# Patient Record
Sex: Male | Born: 1960 | Race: White | Hispanic: No | Marital: Married | Smoking: Never smoker
Health system: Southern US, Community
[De-identification: ages and names within clinical notes are randomized; demographics above are authoritative.]

## PROBLEM LIST (undated history)

## (undated) DIAGNOSIS — I499 Cardiac arrhythmia, unspecified: Secondary | ICD-10-CM

## (undated) DIAGNOSIS — G4730 Sleep apnea, unspecified: Secondary | ICD-10-CM

## (undated) DIAGNOSIS — E119 Type 2 diabetes mellitus without complications: Secondary | ICD-10-CM

## (undated) DIAGNOSIS — I10 Essential (primary) hypertension: Secondary | ICD-10-CM

## (undated) DIAGNOSIS — M1990 Unspecified osteoarthritis, unspecified site: Secondary | ICD-10-CM

## (undated) DIAGNOSIS — J189 Pneumonia, unspecified organism: Secondary | ICD-10-CM

## (undated) HISTORY — PX: CHOLECYSTECTOMY: SHX55

## (undated) HISTORY — PX: SHOULDER SURGERY: SHX246

## (undated) HISTORY — PX: TONSILLECTOMY: SUR1361

## (undated) HISTORY — PX: BACK SURGERY: SHX140

---

## 1998-04-08 ENCOUNTER — Ambulatory Visit (HOSPITAL_COMMUNITY): Admission: RE | Admit: 1998-04-08 | Discharge: 1998-04-08 | Payer: Self-pay | Admitting: Specialist

## 1998-05-07 ENCOUNTER — Inpatient Hospital Stay (HOSPITAL_COMMUNITY): Admission: RE | Admit: 1998-05-07 | Discharge: 1998-05-10 | Payer: Self-pay | Admitting: Specialist

## 1998-07-19 ENCOUNTER — Encounter: Payer: Self-pay | Admitting: Specialist

## 1998-07-19 ENCOUNTER — Ambulatory Visit (HOSPITAL_COMMUNITY): Admission: RE | Admit: 1998-07-19 | Discharge: 1998-07-19 | Payer: Self-pay | Admitting: Specialist

## 1999-11-11 ENCOUNTER — Encounter: Payer: Self-pay | Admitting: Specialist

## 1999-11-11 ENCOUNTER — Ambulatory Visit (HOSPITAL_COMMUNITY): Admission: RE | Admit: 1999-11-11 | Discharge: 1999-11-11 | Payer: Self-pay | Admitting: Specialist

## 2005-03-02 ENCOUNTER — Encounter: Admission: RE | Admit: 2005-03-02 | Discharge: 2005-03-02 | Payer: Self-pay | Admitting: Orthopedic Surgery

## 2005-05-14 ENCOUNTER — Encounter: Admission: RE | Admit: 2005-05-14 | Discharge: 2005-05-14 | Payer: Self-pay | Admitting: Specialist

## 2006-01-10 ENCOUNTER — Encounter: Payer: Self-pay | Admitting: Specialist

## 2007-09-02 ENCOUNTER — Encounter: Admission: RE | Admit: 2007-09-02 | Discharge: 2007-09-02 | Payer: Self-pay | Admitting: *Deleted

## 2007-09-02 ENCOUNTER — Ambulatory Visit (HOSPITAL_COMMUNITY): Admission: RE | Admit: 2007-09-02 | Discharge: 2007-09-02 | Payer: Self-pay | Admitting: *Deleted

## 2007-09-02 ENCOUNTER — Encounter (INDEPENDENT_AMBULATORY_CARE_PROVIDER_SITE_OTHER): Payer: Self-pay | Admitting: *Deleted

## 2007-09-09 ENCOUNTER — Encounter: Admission: RE | Admit: 2007-09-09 | Discharge: 2007-09-09 | Payer: Self-pay | Admitting: *Deleted

## 2009-05-01 IMAGING — CT CT ABDOMEN W/ CM
2 of 5 series · 17 of 46 positions shown, 19 images · IV contrast (30CC OMNI 350 & [ID] OMNI 300)
Comparison: None

ABDOMEN CT WITH CONTRAST

CLINICAL DATA: pain and increased white blood cell count. Prior appendectomy.
TECHNIQUE: Multidetector CT imaging of the abdomen and pelvis was performed
following the standard protocol during bolus administration of intravenous
contrast.

Contrast:  125 cc Omnipaque 300

[Series 2: abdomen w/out cm · axial · 0.86mm/px · z∈[-410,-25]mm · 14 of 84 slices shown, 16 images]
[im 5/84  soft-tissue]
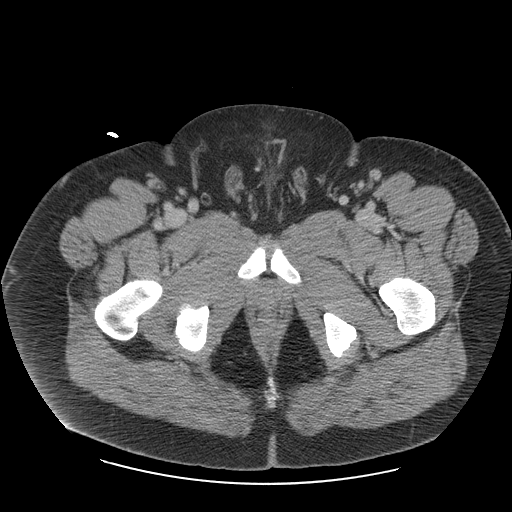
[im 5/84  bone]
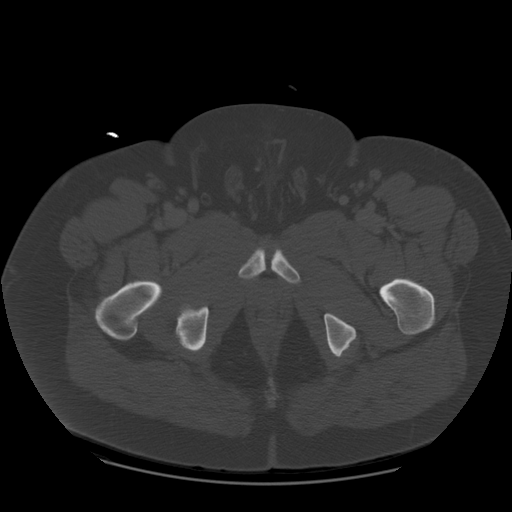
[im 13/84  soft-tissue]
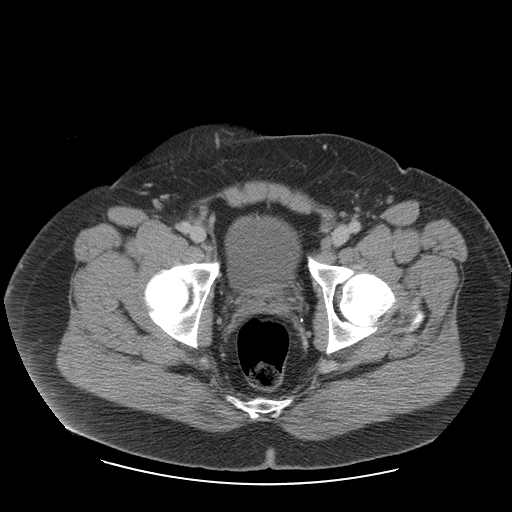
[im 17/84  soft-tissue]
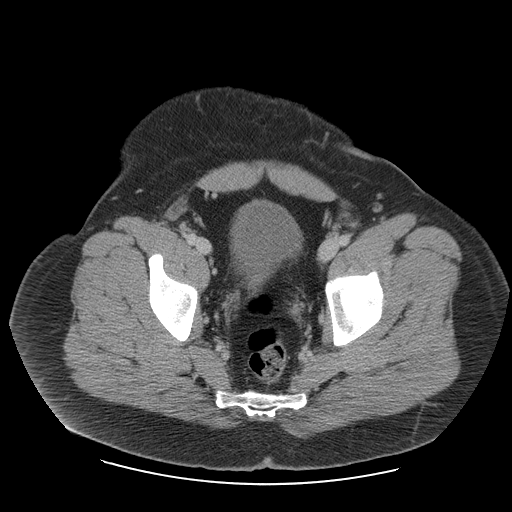
[im 21/84  soft-tissue]
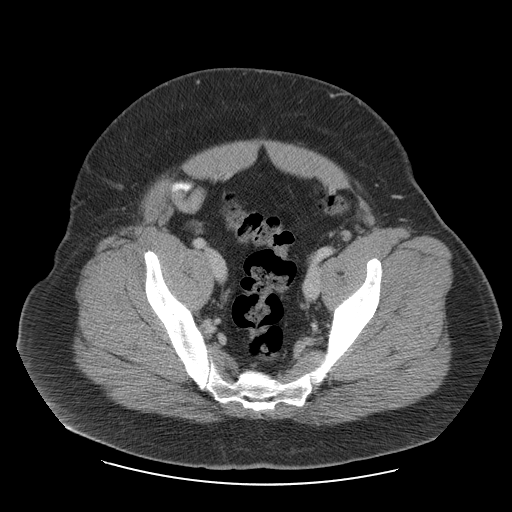
[im 30/84  soft-tissue]
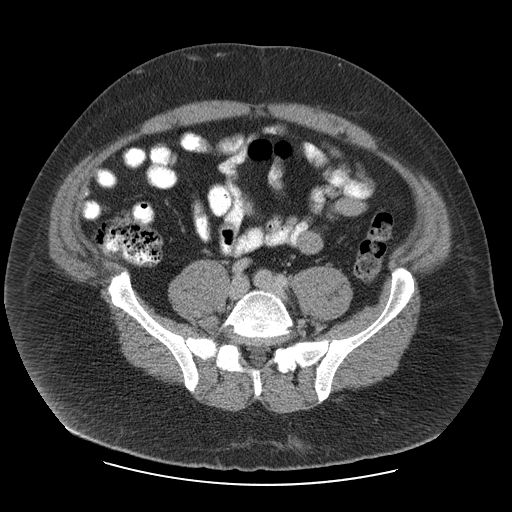
[im 34/84  soft-tissue]
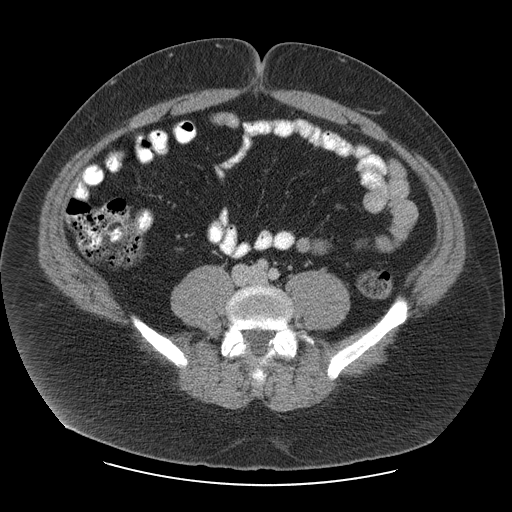
[im 38/84  soft-tissue]
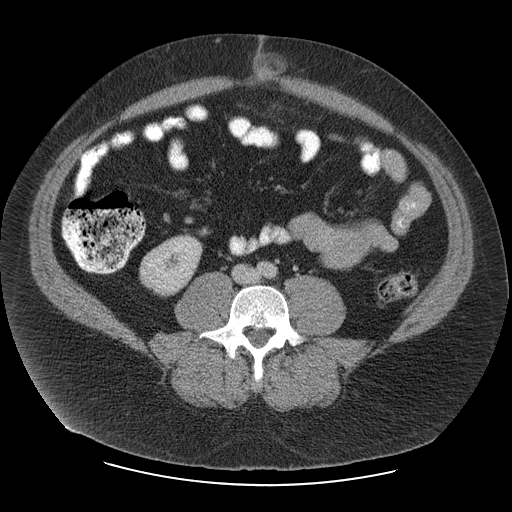
[im 46/84  soft-tissue]
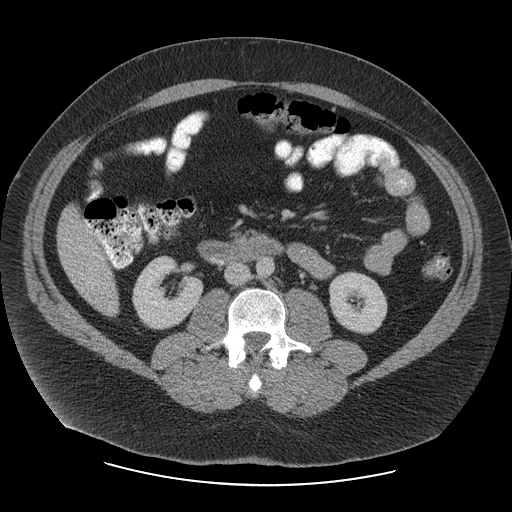
[im 50/84  soft-tissue]
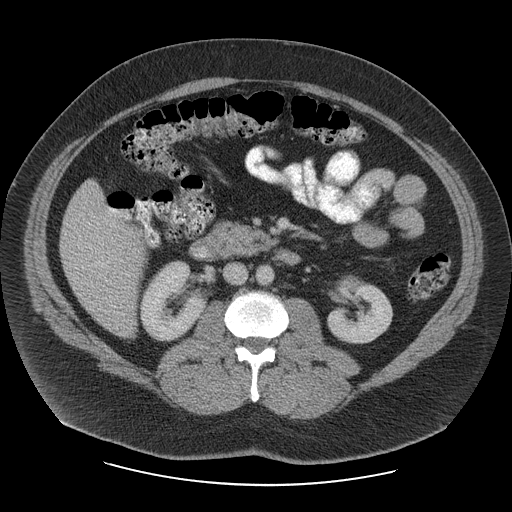
[im 50/84  bone]
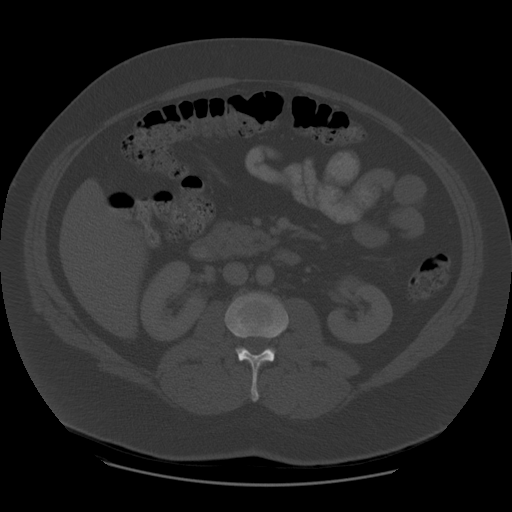
[im 54/84  soft-tissue]
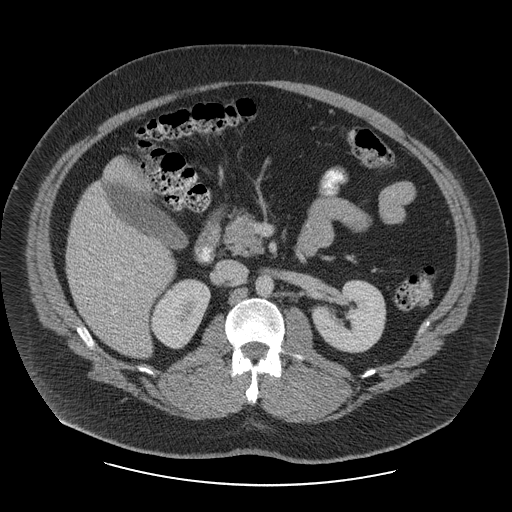
[im 63/84  soft-tissue]
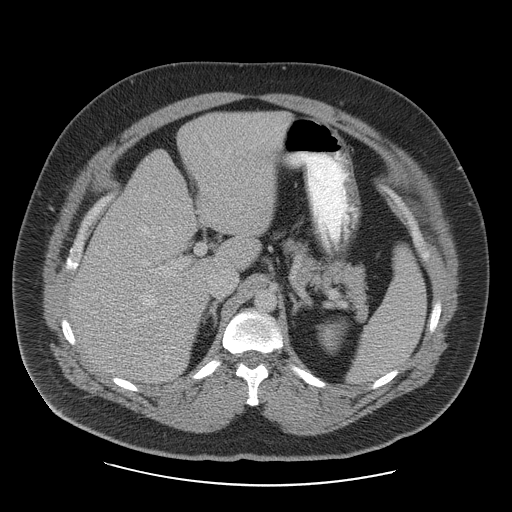
[im 67/84  soft-tissue]
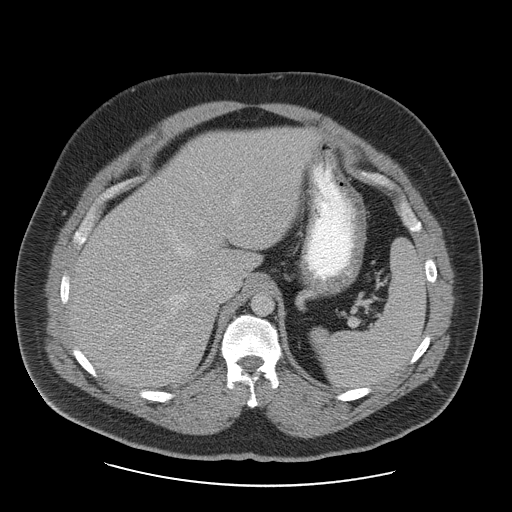
[im 71/84  soft-tissue]
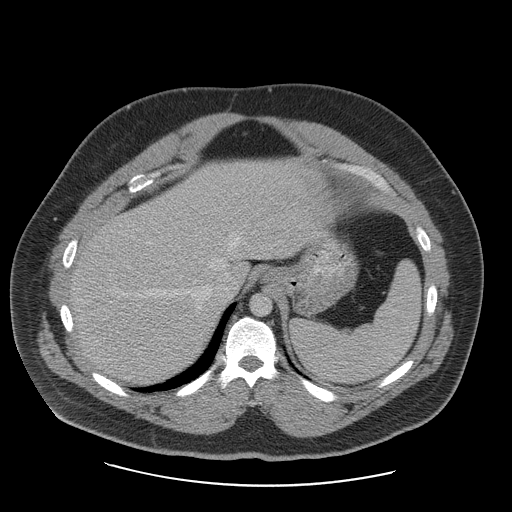
[im 79/84  soft-tissue]
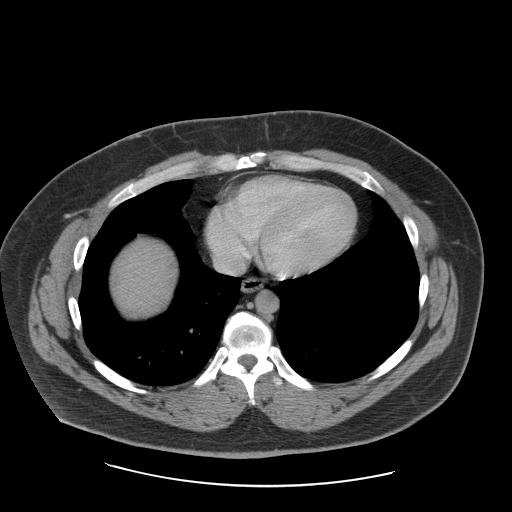

[Series 400: cor abd · coronal · 0.91mm/px · 3 of 134 slices shown]
[im 45/134  soft-tissue]
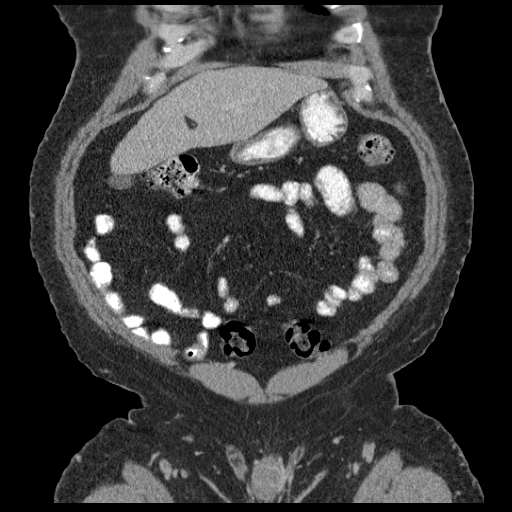
[im 60/134  soft-tissue]
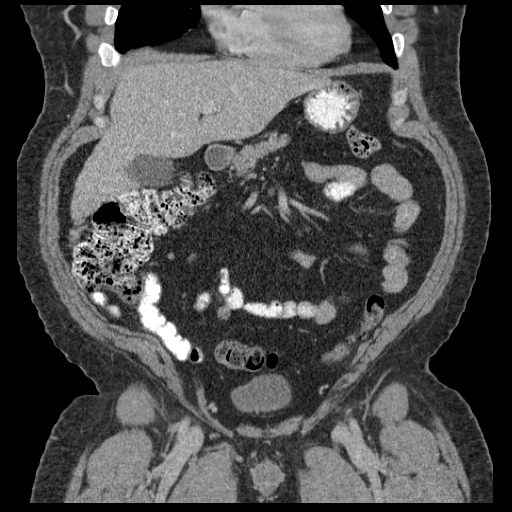
[im 74/134  soft-tissue]
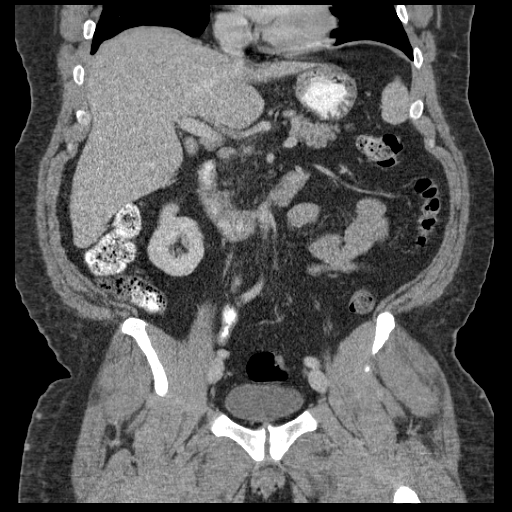

[17 of 46 positions shown; findings below may reference images not displayed]

FINDINGS: Clear lung bases. Normal heart size without pericardial or pleural
effusion.

normal liver. Old granulomatous disease in the spleen. Normal stomach, pancreas,
gallbladder, biliary tract, adrenal glands, and kidneys. No retroperitoneal or
retro crural adenopathy.

Descending colonic diverticulosis. No evidence of diverticulitis.

Normal terminal ileum on image 52. Cholecystectomy.

Small bowel is normal and there is no ascites. A left paracentral ventral hernia
contains omental fat on image 45.

IMPRESSION

1. No acute abdominal process.
2. Small ventral abdominal hernia contains only fat.

PELVIS CT WITH CONTRAST
FINDINGS: Normal pelvic bowel loops. No pelvic adenopathy. Normal urinary
bladder and prostate. Prominent vessels in the scrotum may relate to
varicoceles. Image 92. No cause identified within the abdomen.

Prior surgical defect right iliac crest. Bilateral pars defects at L5.

L5-S1 disc bulge.

IMPRESSION

1. No acute pelvic process.
2. Prominent vessels in the scrotum may relate to varicoceles. Incompletely
imaged. Clinically correlate.

## 2011-01-24 NOTE — Op Note (Signed)
Juan Decker, Juan Decker                 ACCOUNT NO.:  [id number]   MEDICAL RECORD NO.:  [id number]          PATIENT TYPE:  AMB   LOCATION:  ENDO                         FACILITY:  [facility]   PHYSICIAN:  Georgiana Spinner, M.D.    DATE OF BIRTH:  03-01-1961   DATE OF PROCEDURE:  09/02/2007  DATE OF DISCHARGE:                               OPERATIVE REPORT   PROCEDURE:  Upper endoscopy with biopsy.   INDICATIONS:  Abdominal pain.   ANESTHESIA:  Fentanyl 50 mcg, Versed 6 mg.   DESCRIPTION OF PROCEDURE:  With the patient mildly sedated in the left  lateral decubitus position, the Pentax videoscopic endoscope was  inserted in the mouth, passed under direct vision through the esophagus  which appeared to be mildly inflamed at the squamocolumnar junction, was  photographed and biopsies were taken.  We entered into the stomach,  fundus, body, antrum, duodenal bulb, second portion of the duodenum  appeared normal.   From this point the endoscope was slowly withdrawn, taking  circumferential views of the duodenal mucosa until the endoscope had  been pulled back, and the stomach placed in retroflexion to view the  stomach from below.  The endoscope was straightened and withdrawn,  taking circumferential views of the remaining gastric and esophageal  mucosa.  The patient's vital signs, and pulse oximeter remained stable.  The patient tolerated the procedure well without apparent complication.   FINDINGS:  Mild erythema of the squamocolumnar junction biopsied.  Await  biopsy report.  The patient will call me for results, and follow up with  me as an outpatient.           ______________________________  Georgiana Spinner, M.D.     GMO/MEDQ  D:  09/02/2007  T:  09/02/2007  Job:  [id number]

## 2012-02-13 ENCOUNTER — Encounter: Payer: Self-pay | Admitting: Obstetrics and Gynecology

## 2020-03-18 NOTE — Patient Instructions (Addendum)
DUE TO COVID-19 ONLY ONE VISITOR IS ALLOWED TO COME WITH YOU AND STAY IN THE WAITING ROOM ONLY DURING PRE OP AND PROCEDURE DAY OF SURGERY. TWO  VISITOR MAY VISIT WITH YOU AFTER SURGERY IN YOUR PRIVATE ROOM DURING VISITING HOURS ONLY!  10a-8p  YOU NEED TO HAVE A COVID 19 TEST ON_7-16-21______ @_______ , THIS TEST MUST BE DONE BEFORE SURGERY, COME  [address], [place] , .  [facility]) ONCE YOUR COVID TEST IS COMPLETED, PLEASE BEGIN THE QUARANTINE INSTRUCTIONS AS OUTLINED IN YOUR HANDOUT.                Juan Decker  03/18/2020   Your procedure is scheduled on: 03-30-20   Report to [facility] Main  Entrance   Report to admitting at     1025 AM     Call this number if you have problems the morning of surgery [phone]    Remember: NO SOLID FOOD AFTER MIDNIGHT THE NIGHT PRIOR TO SURGERY. NOTHING BY MOUTH EXCEPT CLEAR LIQUIDS UNTIL    0950 am  . PLEASE FINISH G2  DRINK PER SURGEON ORDER  WHICH NEEDS TO BE COMPLETED AT     0950 am then nothing by mouth.    CLEAR LIQUID DIET   Foods Allowed                                                                                        Foods Excluded  Coffee and tea, regular and decaf   No creamer                            liquids that you cannot  Plain Jell-O any favor except red or purple                                           see through such as: Fruit ices (not with fruit pulp)                                                           milk, soups, orange juice  Iced Popsicles                                                                  All solid food Carbonated beverages, regular and diet                                    Cranberry, grape and apple juices Sports drinks like Gatorade Lightly seasoned clear  broth or consume(fat free) Sugar, honey syrup   _____________________________________________________________________    BRUSH YOUR TEETH MORNING OF SURGERY AND RINSE YOUR MOUTH OUT,  NO CHEWING GUM CANDY OR MINTS.     Take these medicines the morning of surgery with A SIP OF WATER: lyrica, morphine, metoprolol, flecainide  Glipizide day before surgery only take morning and lunch dose  No dinner or bedtime dose   DO NOT TAKE ANY DIABETIC MEDICATIONS DAY OF YOUR SURGERY                               You may not have any metal on your body including hair pins and              piercings  Do not wear jewelry, lotions, powders or perfumes, deodorant                         Men may shave face and neck.   Do not bring valuables to the hospital. [facility] IS NOT             RESPONSIBLE   FOR VALUABLES.  Contacts, dentures or bridgework may not be worn into surgery.    Patients discharged the day of surgery will not be allowed to drive home. IF YOU ARE HAVING SURGERY AND GOING HOME THE SAME DAY, YOU MUST HAVE AN ADULT TO DRIVE YOU HOME AND BE WITH YOU FOR 24 HOURS. YOU MAY GO HOME BY TAXI OR UBER OR ORTHERWISE, BUT AN ADULT MUST ACCOMPANY YOU HOME AND STAY WITH YOU FOR 24 HOURS.  Name and phone number of your driver:  Special Instructions: N/A              Please read over the following fact sheets you were given: _____________________________________________________________________          [facility] - Preparing for Surgery Before surgery, you can play an important role.  Because skin is not sterile, your skin needs to be as free of germs as possible.  You can reduce the number of germs on your skin by washing with CHG (chlorahexidine gluconate) soap before surgery.  CHG is an antiseptic cleaner which kills germs and bonds with the skin to continue killing germs even after washing. Please DO NOT use if you have an allergy to CHG or antibacterial soaps.  If your skin becomes reddened/irritated stop using the CHG and inform your nurse when you arrive at Short Stay. Do not shave (including legs and underarms) for at least 48 hours prior to the first CHG shower.  You may  shave your face/neck. Please follow these instructions carefully:  1.  Shower with CHG Soap the night before surgery and the  morning of Surgery.  2.  If you choose to wash your hair, wash your hair first as usual with your  normal  shampoo.  3.  After you shampoo, rinse your hair and body thoroughly to remove the  shampoo.                           4.  Use CHG as you would any other liquid soap.  You can apply chg directly  to the skin and wash                       Gently with a scrungie or clean washcloth.  5.  Apply the CHG Soap to your body ONLY FROM THE NECK DOWN.   Do not use on face/ open                           Wound or open sores. Avoid contact with eyes, ears mouth and genitals (private parts).                       Wash face,  Genitals (private parts) with your normal soap.             6.  Wash thoroughly, paying special attention to the area where your surgery  will be performed.  7.  Thoroughly rinse your body with warm water from the neck down.  8.  DO NOT shower/wash with your normal soap after using and rinsing off  the CHG Soap.                9.  Pat yourself dry with a clean towel.            10.  Wear clean pajamas.            11.  Place clean sheets on your bed the night of your first shower and do not  sleep with pets. Day of Surgery : Do not apply any lotions/deodorants the morning of surgery.  Please wear clean clothes to the hospital/surgery center.  FAILURE TO FOLLOW THESE INSTRUCTIONS MAY RESULT IN THE CANCELLATION OF YOUR SURGERY PATIENT SIGNATURE_________________________________  NURSE SIGNATURE__________________________________  ________________________________________________________________________   Juan Decker  An incentive spirometer is a tool that can help keep your lungs clear and active. This tool measures how well you are filling your lungs with each breath. Taking long deep breaths may help reverse or decrease the chance of developing  breathing (pulmonary) problems (especially infection) following:  A long period of time when you are unable to move or be active. BEFORE THE PROCEDURE   If the spirometer includes an indicator to show your best effort, your nurse or respiratory therapist will set it to a desired goal.  If possible, sit up straight or lean slightly forward. Try not to slouch.  Hold the incentive spirometer in an upright position. INSTRUCTIONS FOR USE  1. Sit on the edge of your bed if possible, or sit up as far as you can in bed or on a chair. 2. Hold the incentive spirometer in an upright position. 3. Breathe out normally. 4. Place the mouthpiece in your mouth and seal your lips tightly around it. 5. Breathe in slowly and as deeply as possible, raising the piston or the ball toward the top of the column. 6. Hold your breath for 3-5 seconds or for as long as possible. Allow the piston or ball to fall to the bottom of the column. 7. Remove the mouthpiece from your mouth and breathe out normally. 8. Rest for a few seconds and repeat Steps 1 through 7 at least 10 times every 1-2 hours when you are awake. Take your time and take a few normal breaths between deep breaths. 9. The spirometer may include an indicator to show your best effort. Use the indicator as a goal to work toward during each repetition. 10. After each set of 10 deep breaths, practice coughing to be sure your lungs are clear. If you have an incision (the cut made at the time of surgery), support your incision when coughing by placing a  pillow or rolled up towels firmly against it. Once you are able to get out of bed, walk around indoors and cough well. You may stop using the incentive spirometer when instructed by your caregiver.  RISKS AND COMPLICATIONS  Take your time so you do not get dizzy or light-headed.  If you are in pain, you may need to take or ask for pain medication before doing incentive spirometry. It is harder to take a deep  breath if you are having pain. AFTER USE  Rest and breathe slowly and easily.  It can be helpful to keep track of a log of your progress. Your caregiver can provide you with a simple table to help with this. If you are using the spirometer at home, follow these instructions: [place] IF:   You are having difficultly using the spirometer.  You have trouble using the spirometer as often as instructed.  Your pain medication is not giving enough relief while using the spirometer.  You develop fever of 100.5 F (38.1 C) or higher. SEEK IMMEDIATE MEDICAL CARE IF:   You cough up bloody sputum that had not been present before.  You develop fever of 102 F (38.9 C) or greater.  You develop worsening pain at or near the incision site. MAKE SURE YOU:   Understand these instructions.  Will watch your condition.  Will get help right away if you are not doing well or get worse. Document Released: 01/08/2007 Document Revised: 11/20/2011 Document Reviewed: 03/11/2007 [facility] Patient Information 2014 [place], [place].   ________________________________________________________________________

## 2020-03-18 NOTE — Progress Notes (Addendum)
PCP - Hazle Coca PA Cardiologist - Dr. Alphia Moh 03-08-20 lov   PPM/ICD -  Device Orders -  Rep Notified -   Chest x-ray -  EKG - 03-08-20 on chart Stress Test -  ECHO - 03-25-20 care everywhere Cardiac Cath -  hgba1c 02-11-20 5.5 care everywhere  Sleep Study -  CPAP -   Fasting Blood Sugar -  Checks Blood Sugar _____ times a day  Blood Thinner Instructions:Xarelto stop 3 days prior to surgery Aspirin Instructions:  ERAS Protcol - PRE-SURGERY Ensure or G2-   COVID TEST- 7-16  Activity able to walk a flight of stairs without SOB  Anesthesia review: a fib , htn, DM, OSA cpap  Patient denies shortness of breath, fever, cough and chest pain at PAT appointment  NONE   All instructions explained to the patient, with a verbal understanding of the material. Patient agrees to go over the instructions while at home for a better understanding. Patient also instructed to self quarantine after being tested for COVID-19. The opportunity to ask questions was provided.

## 2020-03-25 ENCOUNTER — Encounter (HOSPITAL_COMMUNITY)
Admission: RE | Admit: 2020-03-25 | Discharge: 2020-03-25 | Disposition: A | Discharge status: Home / Self Care | Payer: Medicare Other | Source: Ambulatory Visit | Attending: Orthopedic Surgery | Admitting: Orthopedic Surgery

## 2020-03-25 ENCOUNTER — Encounter (HOSPITAL_COMMUNITY): Payer: Self-pay

## 2020-03-25 ENCOUNTER — Other Ambulatory Visit: Payer: Self-pay

## 2020-03-25 DIAGNOSIS — Z01812 Encounter for preprocedural laboratory examination: Secondary | ICD-10-CM | POA: Diagnosis present

## 2020-03-25 HISTORY — DX: Pneumonia, unspecified organism: J18.9

## 2020-03-25 HISTORY — DX: Essential (primary) hypertension: I10

## 2020-03-25 HISTORY — DX: Type 2 diabetes mellitus without complications: E11.9

## 2020-03-25 HISTORY — DX: Unspecified osteoarthritis, unspecified site: M19.90

## 2020-03-25 HISTORY — DX: Cardiac arrhythmia, unspecified: I49.9

## 2020-03-25 HISTORY — DX: Sleep apnea, unspecified: G47.30

## 2020-03-25 LAB — CBC
HCT: 47.1 % (ref 39.0–52.0)
Hemoglobin: 15.2 g/dL (ref 13.0–17.0)
MCH: 27.2 pg (ref 26.0–34.0)
MCHC: 32.3 g/dL (ref 30.0–36.0)
MCV: 84.3 fL (ref 80.0–100.0)
Platelets: 358 10*3/uL (ref 150–400)
RBC: 5.59 MIL/uL (ref 4.22–5.81)
RDW: 14 % (ref 11.5–15.5)
WBC: 14 10*3/uL — ABNORMAL HIGH (ref 4.0–10.5)
nRBC: 0 % (ref 0.0–0.2)

## 2020-03-25 LAB — GLUCOSE, CAPILLARY
Glucose-Capillary: 58 mg/dL — ABNORMAL LOW (ref 70–99)
Glucose-Capillary: 50 mg/dL — ABNORMAL LOW (ref 70–99)
Glucose-Capillary: 58 mg/dL — ABNORMAL LOW (ref 70–99)
Glucose-Capillary: 79 mg/dL (ref 70–99)

## 2020-03-25 LAB — BASIC METABOLIC PANEL
Anion gap: 12 (ref 5–15)
BUN: 28 mg/dL — ABNORMAL HIGH (ref 6–20)
CO2: 28 mmol/L (ref 22–32)
Calcium: 9.6 mg/dL (ref 8.9–10.3)
Chloride: 97 mmol/L — ABNORMAL LOW (ref 98–111)
Creatinine, Ser: 1.11 mg/dL (ref 0.61–1.24)
GFR calc Af Amer: >60 mL/min (ref >60)
GFR calc non Af Amer: >60 mL/min (ref >60)
Glucose, Bld: 71 mg/dL (ref 70–99)
Potassium: 4.5 mmol/L (ref 3.5–5.1)
Sodium: 137 mmol/L (ref 135–145)

## 2020-03-25 LAB — SURGICAL PCR SCREEN
MRSA, PCR: NEGATIVE
Staphylococcus aureus: NEGATIVE

## 2020-03-26 ENCOUNTER — Other Ambulatory Visit (HOSPITAL_COMMUNITY)
Admission: RE | Admit: 2020-03-26 | Discharge: 2020-03-26 | Disposition: A | Discharge status: Home / Self Care | Payer: Medicare Other | Source: Ambulatory Visit | Attending: Orthopedic Surgery | Admitting: Orthopedic Surgery

## 2020-03-26 DIAGNOSIS — Z01818 Encounter for other preprocedural examination: Secondary | ICD-10-CM | POA: Diagnosis present

## 2020-03-26 DIAGNOSIS — Z20822 Contact with and (suspected) exposure to covid-19: Secondary | ICD-10-CM | POA: Insufficient documentation

## 2020-03-26 LAB — SARS CORONAVIRUS 2 (TAT 6-24 HRS): SARS Coronavirus 2: NEGATIVE

## 2020-03-29 NOTE — Progress Notes (Signed)
Anesthesia Chart Review   Case: [id number] Date/Time: 03/30/20 1240   Procedure: TOTAL KNEE ARTHROPLASTY (Left Knee)   Anesthesia type: Spinal   Pre-op diagnosis: LEFT KNEE OSTEOARTHRITIS   Location: [facility] ROOM 09 / [facility]   Surgeons: Durene Romans, MD      DISCUSSION:59 y.o. never smoker with h/o HTN, DM II, sleep apnea, A-fib (Xarelto), left knee OA scheduled for above procedure 03/30/2020 with Dr. Durene Romans.   Pt last seen by cardiology 03/08/2020.  Per OV note, "Patient will have an echocardiogram to reestablish his LV function.  He will follow-up here in about 6 months unless otherwise dictated by the echocardiogram.  He should be considered cleared for the intended surgery without further evaluation and should be able to stop or hold his blood thinner for the surgery and then restart when deemed appropriate by Ortho."  Echo 03/25/2020 with EF 60%, mild tricuspid regurgitation, estimated right ventricular systolic pressure is 45 mmHg.   Pt advised to hold Xarelto 3 days prior to procedure.   Anticipate pt can proceed with planned procedure barring acute status change.   VS: BP 110/68   Pulse 61   Temp 36.8 C (Oral)   Resp 16   Ht 5\' 7"  (1.702 m)   Wt 110.7 kg   SpO2 97%   BMI 38.22 kg/m   PROVIDERS: Card, , MD is PCP  Aloha Gell, MD is Cardiologist  LABS: Labs reviewed: Acceptable for surgery. (all labs ordered are listed, but only abnormal results are displayed)  Labs Reviewed  BASIC METABOLIC PANEL - Abnormal; Notable for the following components:      Result Value   Chloride 97 (*)    BUN 28 (*)    All other components within normal limits  CBC - Abnormal; Notable for the following components:   WBC 14.0 (*)    All other components within normal limits  GLUCOSE, CAPILLARY - Abnormal; Notable for the following components:   Glucose-Capillary 58 (*)    All other components within normal limits  GLUCOSE, CAPILLARY - Abnormal; Notable for the following  components:   Glucose-Capillary 50 (*)    All other components within normal limits  GLUCOSE, CAPILLARY - Abnormal; Notable for the following components:   Glucose-Capillary 58 (*)    All other components within normal limits  SURGICAL PCR SCREEN  GLUCOSE, CAPILLARY  TYPE AND SCREEN     IMAGES:   EKG: 03/08/2020 (on chart) 58 bpm  Sinus bradycardia  Possible left atrial enlargement Otherwise normal When compared with ECG 19-Feb-2019 15:37 No significant change was found   CV: Echo 03/25/2020 SUMMARY  The left ventricular size is normal.  There is normal left ventricular wall thickness.  Left ventricular systolic function is normal.  LV ejection fraction = 60%.  The left ventricular wall motion is normal.  There is mild tricuspid regurgitation.  Estimated right ventricular systolic pressure is 45 mmHg.   Past Medical History:  Diagnosis Date  . Arthritis   . Diabetes mellitus without complication (HCC)    type 2  . Dysrhythmia    Afib  . Hypertension   . Pneumonia   . Sleep apnea    wears cpap    Past Surgical History:  Procedure Laterality Date  . BACK SURGERY     x3 lower 1 upper  . CHOLECYSTECTOMY    . SHOULDER SURGERY     rotator cuff  right  . TONSILLECTOMY      MEDICATIONS: . amLODipine-benazepril (LOTREL)  5-20 MG capsule  . cyclobenzaprine (FLEXERIL) 10 MG tablet  . flecainide (TAMBOCOR) 100 MG tablet  . glipiZIDE (GLUCOTROL) 10 MG tablet  . metFORMIN (GLUCOPHAGE) 1000 MG tablet  . metoprolol succinate (TOPROL-XL) 100 MG 24 hr tablet  . morphine (MS CONTIN) 30 MG 12 hr tablet  . oxyCODONE-acetaminophen (PERCOCET) 10-325 MG tablet  . pravastatin (PRAVACHOL) 20 MG tablet  . pregabalin (LYRICA) 100 MG capsule  . rivaroxaban (XARELTO) 20 MG TABS tablet  . testosterone cypionate (DEPOTESTOSTERONE CYPIONATE) 200 MG/ML injection  . zolpidem (AMBIEN) 10 MG tablet   No current facility-administered medications for this encounter.     Janey Genta [facility] Pre-Surgical Testing [phone] 03/29/20  3:03 PM

## 2020-03-29 NOTE — Anesthesia Preprocedure Evaluation (Addendum)
Anesthesia Evaluation  Patient identified by MRN, date of birth, ID band Patient awake    Reviewed: Allergy & Precautions, NPO status , Patient's Chart, lab work & pertinent test results  Airway Mallampati: III  TM Distance: <3 FB Neck ROM: Full    Dental no notable dental hx.    Pulmonary sleep apnea and Continuous Positive Airway Pressure Ventilation ,    Pulmonary exam normal breath sounds clear to auscultation       Cardiovascular hypertension, Normal cardiovascular exam+ dysrhythmias Atrial Fibrillation  Rhythm:Regular Rate:Normal  On xarelto, held for 72 hours   Neuro/Psych negative neurological ROS  negative psych ROS   GI/Hepatic negative GI ROS, Neg liver ROS,   Endo/Other  diabetesMorbid obesity  Renal/GU negative Renal ROS  negative genitourinary   Musculoskeletal negative musculoskeletal ROS (+)   Abdominal   Peds negative pediatric ROS (+)  Hematology negative hematology ROS (+)   Anesthesia Other Findings   Reproductive/Obstetrics negative OB ROS                           Anesthesia Physical Anesthesia Plan  ASA: III  Anesthesia Plan: Spinal   Post-op Pain Management:  Regional for Post-op pain   Induction: Intravenous  PONV Risk Score and Plan: 2 and Ondansetron, Dexamethasone and Treatment may vary due to age or medical condition  Airway Management Planned: Simple Face Mask  Additional Equipment:   Intra-op Plan:   Post-operative Plan:   Informed Consent: I have reviewed the patients History and Physical, chart, labs and discussed the procedure including the risks, benefits and alternatives for the proposed anesthesia with the patient or authorized representative who has indicated his/her understanding and acceptance.     Dental advisory given  Plan Discussed with: CRNA and Surgeon  Anesthesia Plan Comments: (See PAT note 03/25/2020, Jodell Cipro, PA-C)        Anesthesia Quick Evaluation

## 2020-03-29 NOTE — H&P (Signed)
TOTAL KNEE ADMISSION H&P  Patient is being admitted for left total knee arthroplasty.  Subjective:  Chief Complaint:left knee pain.  HPI: Juan Decker, 59 y.o. male, has a history of pain and functional disability in the left knee due to arthritis and has failed non-surgical conservative treatments for greater than 12 weeks to includecorticosteriod injections and activity modification.  Onset of symptoms was gradual, starting 3 years ago with gradually worsening course since that time. The patient noted no past surgery on the left knee(s).  Patient currently rates pain in the left knee(s) at 8 out of 10 with activity. Patient has worsening of pain with activity and weight bearing and pain that interferes with activities of daily living.  Patient has evidence of joint space narrowing by imaging studies.  There is no active infection.  There are no problems to display for this patient.  Past Medical History:  Diagnosis Date  . Arthritis   . Diabetes mellitus without complication (HCC)    type 2  . Dysrhythmia    Afib  . Hypertension   . Pneumonia   . Sleep apnea    wears cpap    Past Surgical History:  Procedure Laterality Date  . BACK SURGERY     x3 lower 1 upper  . CHOLECYSTECTOMY    . SHOULDER SURGERY     rotator cuff  right  . TONSILLECTOMY      No current facility-administered medications for this encounter.   Current Outpatient Medications  Medication Sig Dispense Refill Last Dose  . amLODipine-benazepril (LOTREL) 5-20 MG capsule Take 1 capsule by mouth daily.     . cyclobenzaprine (FLEXERIL) 10 MG tablet Take 10 mg by mouth 3 (three) times daily as needed for muscle spasms.     . flecainide (TAMBOCOR) 100 MG tablet Take 100 mg by mouth 2 (two) times daily.     Marland Kitchen glipiZIDE (GLUCOTROL) 10 MG tablet Take 10 mg by mouth 2 (two) times daily before a meal.     . metFORMIN (GLUCOPHAGE) 1000 MG tablet Take 1,000 mg by mouth 2 (two) times daily with a meal.     . metoprolol  succinate (TOPROL-XL) 100 MG 24 hr tablet Take 100 mg by mouth daily. Take with or immediately following a meal.     . morphine (MS CONTIN) 30 MG 12 hr tablet Take 30 mg by mouth every 12 (twelve) hours.     Marland Kitchen oxyCODONE-acetaminophen (PERCOCET) 10-325 MG tablet Take 1 tablet by mouth every 6 (six) hours.     . pravastatin (PRAVACHOL) 20 MG tablet Take 20 mg by mouth every evening.     . pregabalin (LYRICA) 100 MG capsule Take 100 mg by mouth 3 (three) times daily.     . rivaroxaban (XARELTO) 20 MG TABS tablet Take 20 mg by mouth daily with supper.     . testosterone cypionate (DEPOTESTOSTERONE CYPIONATE) 200 MG/ML injection Inject 100 mg into the skin every 14 (fourteen) days.     Marland Kitchen zolpidem (AMBIEN) 10 MG tablet Take 10 mg by mouth at bedtime as needed for sleep.      Allergies  Allergen Reactions  . Hydromorphone Swelling    In the eyes     Social History   Tobacco Use  . Smoking status: Never Smoker  . Smokeless tobacco: Never Used  Substance Use Topics  . Alcohol use: Never    No family history on file.   Review of Systems  Constitutional: Negative for chills and fever.  Respiratory: Negative for cough and shortness of breath.   Cardiovascular: Negative for chest pain.  Gastrointestinal: Negative for nausea and vomiting.  Musculoskeletal: Positive for arthralgias.    Objective:  Physical Exam Patient is a 59 year old male.  Obese and well developed. General: Alert and oriented x3, cooperative and pleasant, no acute distress. Head: normocephalic, atraumatic, neck supple. Eyes: EOMI. Respiratory: breath sounds clear in all fields, no wheezing, rales, or rhonchi. Cardiovascular: Regular rate and rhythm, no murmurs, gallops or rubs.  Musculoskeletal: Right Knee Exam: His knee shows a slight varus deformity. Tenderness to medial joint line. Pain on medial McMurray's but no clunk. No ligament is instability of collateral or cruciate testing. Moderate patellar crepitation  is noted. He has 3 from full extension with further flexion to 118.  Calves soft and nontender. Motor function intact in LE. Strength 5/5 LE bilaterally. Neuro: Distal pulses 2+. Sensation to light touch intact in LE. Vital signs in last 24 hours:    Labs:   Estimated body mass index is 38.22 kg/m as calculated from the following:   Height as of 03/25/20: 5\' 7"  (1.702 m).   Weight as of 03/25/20: 110.7 kg.   Imaging Review Plain radiographs demonstrate severe degenerative joint disease of the left knee(s). The overall alignment isneutral. The bone quality appears to be adequate for age and reported activity level.      Assessment/Plan:  End stage arthritis, left knee   The patient history, physical examination, clinical judgment of the provider and imaging studies are consistent with end stage degenerative joint disease of the left knee(s) and total knee arthroplasty is deemed medically necessary. The treatment options including medical management, injection therapy arthroscopy and arthroplasty were discussed at length. The risks and benefits of total knee arthroplasty were presented and reviewed. The risks due to aseptic loosening, infection, stiffness, patella tracking problems, thromboembolic complications and other imponderables were discussed. The patient acknowledged the explanation, agreed to proceed with the plan and consent was signed. Patient is being admitted for inpatient treatment for surgery, pain control, PT, OT, prophylactic antibiotics, VTE prophylaxis, progressive ambulation and ADL's and discharge planning. The patient is planning to be discharged home.   Therapy Plans: outpatient therapy at [facility] in [place] Disposition: Home with wife Planned DVT Prophylaxis: Xarelto 20 mg daily DME needed: 3-n-1 PCP: Dr. [facility] Card, clearance received Cardiologist: Dr. Jonny Ruiz, ultrasound scheduled of AA on 7/15 TXA: IV Allergies: Dilaudid - eye swelling Anesthesia  Concerns: none BMI: 39.8 Last HgbA1c: 5.5%  Other: Hx of intermittent a fib in the past. Sees 8/15 in [facility] for pain management, on Morphine 30 mg BID and Percocet 10 mg q6h.   - Patient was instructed on what medications to stop prior to surgery. - Follow-up visit in 2 weeks with Dr. [facility] - Begin physical therapy following surgery - Pre-operative lab work as pre-surgical testing - Prescriptions will be provided in hospital at time of discharge   Patient's anticipated LOS is less than 2 midnights, meeting these requirements: - Younger than 50 - Lives within 1 hour of care - Has a competent adult at home to recover with post-op recover - NO history of  - Chronic pain requiring opiods  - Diabetes  - Coronary Artery Disease  - Heart failure  - Heart attack  - Stroke  - DVT/VTE  - Cardiac arrhythmia  - Respiratory Failure/COPD  - Renal failure  - Anemia  - Advanced Liver disease  78, PA-C Orthopedic Surgery [facility] [place] [phone]  [phone]      

## 2020-03-30 ENCOUNTER — Encounter (HOSPITAL_COMMUNITY): Payer: Self-pay | Admitting: Orthopedic Surgery

## 2020-03-30 ENCOUNTER — Ambulatory Visit (HOSPITAL_COMMUNITY): Payer: Medicare Other | Admitting: Physician Assistant

## 2020-03-30 ENCOUNTER — Encounter (HOSPITAL_COMMUNITY)
Admission: RE | Disposition: A | Discharge status: Home / Self Care | Payer: Self-pay | Source: Home / Self Care | Attending: Orthopedic Surgery

## 2020-03-30 ENCOUNTER — Other Ambulatory Visit: Payer: Self-pay

## 2020-03-30 ENCOUNTER — Ambulatory Visit (HOSPITAL_COMMUNITY): Payer: Medicare Other | Admitting: Certified Registered"

## 2020-03-30 ENCOUNTER — Observation Stay (HOSPITAL_COMMUNITY)
Admission: RE | Admit: 2020-03-30 | Discharge: 2020-03-31 | Disposition: A | Discharge status: Home / Self Care | Payer: Medicare Other | Attending: Orthopedic Surgery | Admitting: Orthopedic Surgery

## 2020-03-30 DIAGNOSIS — G4730 Sleep apnea, unspecified: Secondary | ICD-10-CM | POA: Diagnosis not present

## 2020-03-30 DIAGNOSIS — M25562 Pain in left knee: Secondary | ICD-10-CM | POA: Diagnosis present

## 2020-03-30 DIAGNOSIS — E119 Type 2 diabetes mellitus without complications: Secondary | ICD-10-CM | POA: Insufficient documentation

## 2020-03-30 DIAGNOSIS — Z96652 Presence of left artificial knee joint: Secondary | ICD-10-CM

## 2020-03-30 DIAGNOSIS — I4891 Unspecified atrial fibrillation: Secondary | ICD-10-CM | POA: Diagnosis not present

## 2020-03-30 DIAGNOSIS — Z7984 Long term (current) use of oral hypoglycemic drugs: Secondary | ICD-10-CM | POA: Diagnosis not present

## 2020-03-30 DIAGNOSIS — M1712 Unilateral primary osteoarthritis, left knee: Principal | ICD-10-CM | POA: Insufficient documentation

## 2020-03-30 DIAGNOSIS — E669 Obesity, unspecified: Secondary | ICD-10-CM | POA: Diagnosis present

## 2020-03-30 DIAGNOSIS — I10 Essential (primary) hypertension: Secondary | ICD-10-CM | POA: Insufficient documentation

## 2020-03-30 DIAGNOSIS — Z9989 Dependence on other enabling machines and devices: Secondary | ICD-10-CM | POA: Diagnosis not present

## 2020-03-30 DIAGNOSIS — Z79899 Other long term (current) drug therapy: Secondary | ICD-10-CM | POA: Diagnosis not present

## 2020-03-30 DIAGNOSIS — Z7901 Long term (current) use of anticoagulants: Secondary | ICD-10-CM | POA: Insufficient documentation

## 2020-03-30 HISTORY — PX: TOTAL KNEE ARTHROPLASTY: SHX125

## 2020-03-30 LAB — ABO/RH: ABO/RH(D): O NEG

## 2020-03-30 LAB — GLUCOSE, CAPILLARY
Glucose-Capillary: 96 mg/dL (ref 70–99)
Glucose-Capillary: 203 mg/dL — ABNORMAL HIGH (ref 70–99)

## 2020-03-30 LAB — TYPE AND SCREEN
ABO/RH(D): O NEG
Antibody Screen: NEGATIVE

## 2020-03-30 SURGERY — ARTHROPLASTY, KNEE, TOTAL
Anesthesia: Spinal | Site: Knee | Laterality: Left

## 2020-03-30 MED ORDER — BISACODYL 10 MG RE SUPP: 10.0000 mg | Freq: Every day | RECTAL | Status: DC | PRN

## 2020-03-30 MED ORDER — BUPIVACAINE HCL (PF) 0.25 % IJ SOLN
INTRAMUSCULAR | Status: DC | PRN
  Administered 2020-03-30: 30 mL

## 2020-03-30 MED ORDER — ONDANSETRON HCL 4 MG PO TABS: 4.0000 mg | ORAL_TABLET | Freq: Four times a day (QID) | ORAL | Status: DC | PRN

## 2020-03-30 MED ORDER — LACTATED RINGERS IV SOLN: INTRAVENOUS | Status: DC

## 2020-03-30 MED ORDER — GLIPIZIDE 10 MG PO TABS
10.0000 mg | ORAL_TABLET | Freq: Two times a day (BID) | ORAL | Status: DC
  Administered 2020-03-31: 10 mg via ORAL
  Filled 2020-03-30: qty 1

## 2020-03-30 MED ORDER — PRAVASTATIN SODIUM 20 MG PO TABS
20.0000 mg | ORAL_TABLET | Freq: Every evening | ORAL | Status: DC
  Administered 2020-03-30: 20 mg via ORAL
  Filled 2020-03-30: qty 1

## 2020-03-30 MED ORDER — SODIUM CHLORIDE (PF) 0.9 % IJ SOLN
INTRAMUSCULAR | Status: AC
  Filled 2020-03-30: qty 50

## 2020-03-30 MED ORDER — BUPIVACAINE HCL (PF) 0.5 % IJ SOLN
INTRAMUSCULAR | Status: DC | PRN
Start: 2020-03-30 — End: 2020-03-30
  Administered 2020-03-30: 20 mL via PERINEURAL

## 2020-03-30 MED ORDER — FENTANYL CITRATE (PF) 100 MCG/2ML IJ SOLN
50.0000 ug | INTRAMUSCULAR | Status: DC
  Administered 2020-03-30: 50 ug via INTRAVENOUS
  Filled 2020-03-30: qty 2

## 2020-03-30 MED ORDER — TRANEXAMIC ACID-NACL 1000-0.7 MG/100ML-% IV SOLN
INTRAVENOUS | Status: AC
  Administered 2020-03-30: 1000 mg via INTRAVENOUS
  Filled 2020-03-30: qty 100

## 2020-03-30 MED ORDER — MIDAZOLAM HCL 2 MG/2ML IJ SOLN
1.0000 mg | INTRAMUSCULAR | Status: DC
  Administered 2020-03-30: 2 mg via INTRAVENOUS
  Filled 2020-03-30: qty 2

## 2020-03-30 MED ORDER — PREGABALIN 100 MG PO CAPS
100.0000 mg | ORAL_CAPSULE | Freq: Three times a day (TID) | ORAL | Status: DC
  Administered 2020-03-30 – 2020-03-31 (×2): 100 mg via ORAL
  Filled 2020-03-30 (×2): qty 1

## 2020-03-30 MED ORDER — OXYCODONE HCL 5 MG PO TABS
20.0000 mg | ORAL_TABLET | ORAL | Status: DC | PRN
  Administered 2020-03-30 – 2020-03-31 (×4): 20 mg via ORAL
  Filled 2020-03-30 (×4): qty 4

## 2020-03-30 MED ORDER — BUPIVACAINE HCL (PF) 0.25 % IJ SOLN
INTRAMUSCULAR | Status: AC
  Filled 2020-03-30: qty 30

## 2020-03-30 MED ORDER — OXYCODONE HCL 5 MG PO TABS
ORAL_TABLET | ORAL | Status: AC
  Administered 2020-03-30: 20 mg via ORAL
  Filled 2020-03-30: qty 4

## 2020-03-30 MED ORDER — ONDANSETRON HCL 4 MG/2ML IJ SOLN
INTRAMUSCULAR | Status: DC | PRN
  Administered 2020-03-30: 4 mg via INTRAVENOUS

## 2020-03-30 MED ORDER — SODIUM CHLORIDE 0.9 % IV SOLN: INTRAVENOUS | Status: DC

## 2020-03-30 MED ORDER — MORPHINE SULFATE ER 30 MG PO TBCR
30.0000 mg | EXTENDED_RELEASE_TABLET | Freq: Two times a day (BID) | ORAL | Status: DC
  Administered 2020-03-30 – 2020-03-31 (×2): 30 mg via ORAL
  Filled 2020-03-30 (×2): qty 1

## 2020-03-30 MED ORDER — KETOROLAC TROMETHAMINE 30 MG/ML IJ SOLN
INTRAMUSCULAR | Status: AC
  Filled 2020-03-30: qty 1

## 2020-03-30 MED ORDER — METHOCARBAMOL 500 MG IVPB - SIMPLE MED
500.0000 mg | Freq: Four times a day (QID) | INTRAVENOUS | Status: DC | PRN
  Administered 2020-03-30: 500 mg via INTRAVENOUS
  Filled 2020-03-30: qty 50

## 2020-03-30 MED ORDER — SODIUM CHLORIDE 0.9 % IR SOLN
Status: DC | PRN
  Administered 2020-03-30: 1000 mL

## 2020-03-30 MED ORDER — POVIDONE-IODINE 10 % EX SWAB
2.0000 "application " | Freq: Once | CUTANEOUS | Status: AC
  Administered 2020-03-30: 2 via TOPICAL

## 2020-03-30 MED ORDER — DEXAMETHASONE SODIUM PHOSPHATE 10 MG/ML IJ SOLN
10.0000 mg | Freq: Once | INTRAMUSCULAR | Status: AC
  Administered 2020-03-31: 10 mg via INTRAVENOUS
  Filled 2020-03-30: qty 1

## 2020-03-30 MED ORDER — METHOCARBAMOL 500 MG IVPB - SIMPLE MED
INTRAVENOUS | Status: AC
  Filled 2020-03-30: qty 50

## 2020-03-30 MED ORDER — ALUM & MAG HYDROXIDE-SIMETH 200-200-20 MG/5ML PO SUSP: 15.0000 mL | ORAL | Status: DC | PRN

## 2020-03-30 MED ORDER — PROPOFOL 10 MG/ML IV BOLUS
INTRAVENOUS | Status: DC | PRN
  Administered 2020-03-30 (×2): 20 mg via INTRAVENOUS

## 2020-03-30 MED ORDER — OXYCODONE HCL 5 MG PO TABS: 10.0000 mg | ORAL_TABLET | ORAL | Status: DC | PRN

## 2020-03-30 MED ORDER — TRANEXAMIC ACID-NACL 1000-0.7 MG/100ML-% IV SOLN
1000.0000 mg | INTRAVENOUS | Status: AC
  Administered 2020-03-30: 1000 mg via INTRAVENOUS
  Filled 2020-03-30: qty 100

## 2020-03-30 MED ORDER — PROPOFOL 500 MG/50ML IV EMUL
INTRAVENOUS | Status: AC
  Filled 2020-03-30: qty 50

## 2020-03-30 MED ORDER — METOPROLOL SUCCINATE ER 50 MG PO TB24
100.0000 mg | ORAL_TABLET | Freq: Every day | ORAL | Status: DC
  Administered 2020-03-31: 100 mg via ORAL
  Filled 2020-03-30: qty 2

## 2020-03-30 MED ORDER — DEXAMETHASONE SODIUM PHOSPHATE 10 MG/ML IJ SOLN
INTRAMUSCULAR | Status: AC
  Filled 2020-03-30: qty 1

## 2020-03-30 MED ORDER — MORPHINE SULFATE (PF) 4 MG/ML IV SOLN
1.0000 mg | INTRAVENOUS | Status: DC | PRN
  Administered 2020-03-30: 1 mg via INTRAVENOUS
  Administered 2020-03-30: 2 mg via INTRAVENOUS

## 2020-03-30 MED ORDER — KETOROLAC TROMETHAMINE 30 MG/ML IJ SOLN
INTRAMUSCULAR | Status: DC | PRN
  Administered 2020-03-30: 30 mg

## 2020-03-30 MED ORDER — PROPOFOL 500 MG/50ML IV EMUL
INTRAVENOUS | Status: DC | PRN
  Administered 2020-03-30: 75 ug/kg/min via INTRAVENOUS

## 2020-03-30 MED ORDER — MENTHOL 3 MG MT LOZG: 1.0000 | LOZENGE | OROMUCOSAL | Status: DC | PRN

## 2020-03-30 MED ORDER — SODIUM CHLORIDE (PF) 0.9 % IJ SOLN
INTRAMUSCULAR | Status: DC | PRN
  Administered 2020-03-30: 30 mL

## 2020-03-30 MED ORDER — FENTANYL CITRATE (PF) 100 MCG/2ML IJ SOLN: 25.0000 ug | INTRAMUSCULAR | Status: DC | PRN

## 2020-03-30 MED ORDER — METHOCARBAMOL 500 MG PO TABS
500.0000 mg | ORAL_TABLET | Freq: Four times a day (QID) | ORAL | Status: DC | PRN
  Administered 2020-03-30 – 2020-03-31 (×2): 500 mg via ORAL
  Filled 2020-03-30 (×2): qty 1

## 2020-03-30 MED ORDER — CEFAZOLIN SODIUM-DEXTROSE 2-4 GM/100ML-% IV SOLN
INTRAVENOUS | Status: AC
  Filled 2020-03-30: qty 100

## 2020-03-30 MED ORDER — FERROUS SULFATE 325 (65 FE) MG PO TABS
325.0000 mg | ORAL_TABLET | Freq: Two times a day (BID) | ORAL | Status: DC
  Administered 2020-03-31: 325 mg via ORAL
  Filled 2020-03-30: qty 1

## 2020-03-30 MED ORDER — CEFAZOLIN SODIUM-DEXTROSE 2-4 GM/100ML-% IV SOLN
2.0000 g | INTRAVENOUS | Status: AC
  Administered 2020-03-30: 2 g via INTRAVENOUS
  Filled 2020-03-30: qty 100

## 2020-03-30 MED ORDER — CEFAZOLIN SODIUM-DEXTROSE 2-4 GM/100ML-% IV SOLN
2.0000 g | Freq: Four times a day (QID) | INTRAVENOUS | Status: AC
  Administered 2020-03-30 (×2): 2 g via INTRAVENOUS
  Filled 2020-03-30: qty 100

## 2020-03-30 MED ORDER — CHLORHEXIDINE GLUCONATE 0.12 % MT SOLN
15.0000 mL | Freq: Once | OROMUCOSAL | Status: AC
  Administered 2020-03-30: 15 mL via OROMUCOSAL

## 2020-03-30 MED ORDER — METFORMIN HCL 500 MG PO TABS
1000.0000 mg | ORAL_TABLET | Freq: Two times a day (BID) | ORAL | Status: DC
  Administered 2020-03-31: 1000 mg via ORAL
  Filled 2020-03-30: qty 2

## 2020-03-30 MED ORDER — FLECAINIDE ACETATE 100 MG PO TABS
100.0000 mg | ORAL_TABLET | Freq: Two times a day (BID) | ORAL | Status: DC
  Administered 2020-03-30 – 2020-03-31 (×2): 100 mg via ORAL
  Filled 2020-03-30 (×3): qty 1

## 2020-03-30 MED ORDER — METOCLOPRAMIDE HCL 5 MG/ML IJ SOLN: 5.0000 mg | Freq: Three times a day (TID) | INTRAMUSCULAR | Status: DC | PRN

## 2020-03-30 MED ORDER — DIPHENHYDRAMINE HCL 12.5 MG/5ML PO ELIX
12.5000 mg | ORAL_SOLUTION | ORAL | Status: DC | PRN
  Administered 2020-03-30: 25 mg via ORAL
  Filled 2020-03-30: qty 10

## 2020-03-30 MED ORDER — TRANEXAMIC ACID-NACL 1000-0.7 MG/100ML-% IV SOLN: 1000.0000 mg | Freq: Once | INTRAVENOUS | Status: AC

## 2020-03-30 MED ORDER — MORPHINE SULFATE (PF) 4 MG/ML IV SOLN
INTRAVENOUS | Status: AC
  Administered 2020-03-30: 1 mg via INTRAVENOUS
  Filled 2020-03-30: qty 1

## 2020-03-30 MED ORDER — ONDANSETRON HCL 4 MG/2ML IJ SOLN: 4.0000 mg | Freq: Four times a day (QID) | INTRAMUSCULAR | Status: DC | PRN

## 2020-03-30 MED ORDER — MAGNESIUM CITRATE PO SOLN: 1.0000 | Freq: Once | ORAL | Status: DC | PRN

## 2020-03-30 MED ORDER — METOCLOPRAMIDE HCL 5 MG PO TABS: 5.0000 mg | ORAL_TABLET | Freq: Three times a day (TID) | ORAL | Status: DC | PRN

## 2020-03-30 MED ORDER — POLYETHYLENE GLYCOL 3350 17 G PO PACK
17.0000 g | PACK | Freq: Two times a day (BID) | ORAL | Status: DC
  Administered 2020-03-30 – 2020-03-31 (×2): 17 g via ORAL
  Filled 2020-03-30 (×2): qty 1

## 2020-03-30 MED ORDER — RIVAROXABAN 10 MG PO TABS
10.0000 mg | ORAL_TABLET | ORAL | Status: DC
  Administered 2020-03-31: 10 mg via ORAL
  Filled 2020-03-30: qty 1

## 2020-03-30 MED ORDER — 0.9 % SODIUM CHLORIDE (POUR BTL) OPTIME
TOPICAL | Status: DC | PRN
  Administered 2020-03-30: 1000 mL

## 2020-03-30 MED ORDER — ORAL CARE MOUTH RINSE: 15.0000 mL | Freq: Once | OROMUCOSAL | Status: AC

## 2020-03-30 MED ORDER — DEXAMETHASONE SODIUM PHOSPHATE 10 MG/ML IJ SOLN
10.0000 mg | Freq: Once | INTRAMUSCULAR | Status: AC
  Administered 2020-03-30: 10 mg via INTRAVENOUS

## 2020-03-30 MED ORDER — ONDANSETRON HCL 4 MG/2ML IJ SOLN
INTRAMUSCULAR | Status: AC
  Filled 2020-03-30: qty 2

## 2020-03-30 MED ORDER — PHENOL 1.4 % MT LIQD: 1.0000 | OROMUCOSAL | Status: DC | PRN

## 2020-03-30 MED ORDER — STERILE WATER FOR IRRIGATION IR SOLN
Status: DC | PRN
  Administered 2020-03-30: 1000 mL

## 2020-03-30 MED ORDER — DOCUSATE SODIUM 100 MG PO CAPS
100.0000 mg | ORAL_CAPSULE | Freq: Two times a day (BID) | ORAL | Status: DC
  Administered 2020-03-30 – 2020-03-31 (×2): 100 mg via ORAL
  Filled 2020-03-30 (×2): qty 1

## 2020-03-30 MED ORDER — ACETAMINOPHEN 500 MG PO TABS
1000.0000 mg | ORAL_TABLET | Freq: Four times a day (QID) | ORAL | Status: DC
  Administered 2020-03-30 – 2020-03-31 (×3): 1000 mg via ORAL
  Filled 2020-03-30 (×3): qty 2

## 2020-03-30 MED ORDER — BUPIVACAINE IN DEXTROSE 0.75-8.25 % IT SOLN
INTRATHECAL | Status: DC | PRN
  Administered 2020-03-30: 1.8 mL via INTRATHECAL

## 2020-03-30 MED ORDER — ZOLPIDEM TARTRATE 10 MG PO TABS
10.0000 mg | ORAL_TABLET | Freq: Every evening | ORAL | Status: DC | PRN
  Administered 2020-03-30: 10 mg via ORAL
  Filled 2020-03-30: qty 1

## 2020-03-30 SURGICAL SUPPLY — 64 items
ATTUNE MED ANAT PAT 38 KNEE (Knees) ×2 IMPLANT
ATTUNE MED ANAT PAT 38MM KNEE (Knees) ×1 IMPLANT
ATTUNE PS FEM LT SZ 5 CEM KNEE (Femur) ×3 IMPLANT
ATTUNE PSRP INSR SZ5 6 KNEE (Insert) ×2 IMPLANT
ATTUNE PSRP INSR SZ5 6MM KNEE (Insert) ×1 IMPLANT
BAG ZIPLOCK 12X15 (MISCELLANEOUS) IMPLANT
BASE TIBIA ATTUNE KNEE SYS SZ6 (Knees) ×1 IMPLANT
BLADE SAW SGTL 11.0X1.19X90.0M (BLADE) IMPLANT
BLADE SAW SGTL 13.0X1.19X90.0M (BLADE) ×3 IMPLANT
BLADE SURG SZ10 CARB STEEL (BLADE) ×6 IMPLANT
BNDG ELASTIC 6X10 VLCR STRL LF (GAUZE/BANDAGES/DRESSINGS) ×3 IMPLANT
BNDG ELASTIC 6X5.8 VLCR STR LF (GAUZE/BANDAGES/DRESSINGS) ×3 IMPLANT
BOWL SMART MIX CTS (DISPOSABLE) ×3 IMPLANT
CEMENT HV SMART SET (Cement) ×6 IMPLANT
COVER SURGICAL LIGHT HANDLE (MISCELLANEOUS) ×3 IMPLANT
COVER WAND RF STERILE (DRAPES) IMPLANT
CUFF TOURN SGL QUICK 34 (TOURNIQUET CUFF) ×3
CUFF TRNQT CYL 34X4.125X (TOURNIQUET CUFF) ×1 IMPLANT
DECANTER SPIKE VIAL GLASS SM (MISCELLANEOUS) ×6 IMPLANT
DERMABOND ADVANCED (GAUZE/BANDAGES/DRESSINGS) ×2
DERMABOND ADVANCED .7 DNX12 (GAUZE/BANDAGES/DRESSINGS) ×1 IMPLANT
DRAPE U-SHAPE 47X51 STRL (DRAPES) ×3 IMPLANT
DRESSING AQUACEL AG SP 3.5X10 (GAUZE/BANDAGES/DRESSINGS) ×1 IMPLANT
DRSG AQUACEL AG ADV 3.5X10 (GAUZE/BANDAGES/DRESSINGS) ×3 IMPLANT
DRSG AQUACEL AG SP 3.5X10 (GAUZE/BANDAGES/DRESSINGS) ×3
DURAPREP 26ML APPLICATOR (WOUND CARE) ×6 IMPLANT
ELECT REM PT RETURN 15FT ADLT (MISCELLANEOUS) ×3 IMPLANT
GLOVE BIO SURGEON STRL SZ 6 (GLOVE) ×3 IMPLANT
GLOVE BIOGEL PI IND STRL 6.5 (GLOVE) ×1 IMPLANT
GLOVE BIOGEL PI IND STRL 7.5 (GLOVE) ×1 IMPLANT
GLOVE BIOGEL PI IND STRL 8.5 (GLOVE) ×1 IMPLANT
GLOVE BIOGEL PI INDICATOR 6.5 (GLOVE) ×2
GLOVE BIOGEL PI INDICATOR 7.5 (GLOVE) ×2
GLOVE BIOGEL PI INDICATOR 8.5 (GLOVE) ×2
GLOVE ECLIPSE 8.0 STRL XLNG CF (GLOVE) ×3 IMPLANT
GLOVE ORTHO TXT STRL SZ7.5 (GLOVE) ×3 IMPLANT
GOWN STRL REUS W/ TWL LRG LVL3 (GOWN DISPOSABLE) ×1 IMPLANT
GOWN STRL REUS W/TWL 2XL LVL3 (GOWN DISPOSABLE) ×3 IMPLANT
GOWN STRL REUS W/TWL LRG LVL3 (GOWN DISPOSABLE) ×6 IMPLANT
HANDPIECE INTERPULSE COAX TIP (DISPOSABLE) ×3
HOLDER FOLEY CATH W/STRAP (MISCELLANEOUS) IMPLANT
KIT TURNOVER KIT A (KITS) IMPLANT
MANIFOLD NEPTUNE II (INSTRUMENTS) ×3 IMPLANT
NDL SAFETY ECLIPSE 18X1.5 (NEEDLE) IMPLANT
NEEDLE HYPO 18GX1.5 SHARP (NEEDLE)
NS IRRIG 1000ML POUR BTL (IV SOLUTION) ×3 IMPLANT
PACK TOTAL KNEE CUSTOM (KITS) ×3 IMPLANT
PENCIL SMOKE EVACUATOR (MISCELLANEOUS) IMPLANT
PIN DRILL FIX HALF THREAD (BIT) ×3 IMPLANT
PIN FIX SIGMA LCS THRD HI (PIN) ×3 IMPLANT
PROTECTOR NERVE ULNAR (MISCELLANEOUS) ×3 IMPLANT
SET HNDPC FAN SPRY TIP SCT (DISPOSABLE) ×1 IMPLANT
SET PAD KNEE POSITIONER (MISCELLANEOUS) ×3 IMPLANT
SUT MNCRL AB 4-0 PS2 18 (SUTURE) ×3 IMPLANT
SUT STRATAFIX PDS+ 0 24IN (SUTURE) ×3 IMPLANT
SUT VIC AB 1 CT1 36 (SUTURE) ×3 IMPLANT
SUT VIC AB 2-0 CT1 27 (SUTURE) ×9
SUT VIC AB 2-0 CT1 TAPERPNT 27 (SUTURE) ×3 IMPLANT
SYR 3ML LL SCALE MARK (SYRINGE) ×3 IMPLANT
TIBIA ATTUNE KNEE SYS BASE SZ6 (Knees) ×3 IMPLANT
TRAY FOLEY MTR SLVR 16FR STAT (SET/KITS/TRAYS/PACK) ×3 IMPLANT
WATER STERILE IRR 1000ML POUR (IV SOLUTION) ×6 IMPLANT
WRAP KNEE MAXI GEL POST OP (GAUZE/BANDAGES/DRESSINGS) ×3 IMPLANT
YANKAUER SUCT BULB TIP 10FT TU (MISCELLANEOUS) ×3 IMPLANT

## 2020-03-30 NOTE — Plan of Care (Signed)

## 2020-03-30 NOTE — Interval H&P Note (Signed)
History and Physical Interval Note:  03/30/2020 10:54 AM  Juan Decker  has presented today for surgery, with the diagnosis of LEFT KNEE OSTEOARTHRITIS.  The various methods of treatment have been discussed with the patient and family. After consideration of risks, benefits and other options for treatment, the patient has consented to  Procedure(s): TOTAL KNEE ARTHROPLASTY (Left) as a surgical intervention.  The patient's history has been reviewed, patient examined, no change in status, stable for surgery.  I have reviewed the patient's chart and labs.  Questions were answered to the patient's satisfaction.     Shelda Pal

## 2020-03-30 NOTE — Anesthesia Procedure Notes (Signed)
Anesthesia Regional Block: Adductor canal block   Pre-Anesthetic Checklist: ,, timeout performed, Correct Patient, Correct Site, Correct Laterality, Correct Procedure, Correct Position, site marked, Risks and benefits discussed,  Surgical consent,  Pre-op evaluation,  At surgeon's request and post-op pain management  Laterality: Left  Prep: chloraprep       Needles:  Injection technique: Single-shot  Needle Type: Echogenic Needle     Needle Length: 9cm      Additional Needles:   Procedures:,,,, ultrasound used (permanent image in chart),,,,  Narrative:  Start time: 03/30/2020 11:38 AM End time: 03/30/2020 11:43 AM Injection made incrementally with aspirations every 5 mL.  Performed by: Personally  Anesthesiologist: Eilene Ghazi, MD  Additional Notes: Patient tolerated the procedure well without complications

## 2020-03-30 NOTE — Progress Notes (Signed)
Assisted Dr. Rose with left, ultrasound guided, adductor canal block. Side rails up, monitors on throughout procedure. See vital signs in flow sheet. Tolerated Procedure well.  

## 2020-03-30 NOTE — Transfer of Care (Signed)
Immediate Anesthesia Transfer of Care Note  Patient: Juan Decker  Procedure(s) Performed: TOTAL KNEE ARTHROPLASTY (Left Knee)  Patient Location: PACU  Anesthesia Type:Regional and Spinal  Level of Consciousness: awake, alert  and oriented  Airway & Oxygen Therapy: Patient Spontanous Breathing and Patient connected to face mask oxygen  Post-op Assessment: Report given to RN and Post -op Vital signs reviewed and stable  Post vital signs: Reviewed and stable  Last Vitals:  Vitals Value Taken Time  BP    Temp    Pulse 55 03/30/20 1401  Resp 14 03/30/20 1401  SpO2 100 % 03/30/20 1401  Vitals shown include unvalidated device data.  Last Pain:  Vitals:   03/30/20 1044  TempSrc:   PainSc: 0-No pain      Patients Stated Pain Goal: 3 (03/30/20 1044)  Complications: No complications documented.

## 2020-03-30 NOTE — Discharge Instructions (Addendum)
INSTRUCTIONS AFTER JOINT REPLACEMENT   o Remove items at home which could result in a fall. This includes throw rugs or furniture in walking pathways o ICE to the affected joint every three hours while awake for 30 minutes at a time, for at least the first 3-5 days, and then as needed for pain and swelling.  Continue to use ice for pain and swelling. You may notice swelling that will progress down to the foot and ankle.  This is normal after surgery.  Elevate your leg when you are not up walking on it.   o Continue to use the breathing machine you got in the hospital (incentive spirometer) which will help keep your temperature down.  It is common for your temperature to cycle up and down following surgery, especially at night when you are not up moving around and exerting yourself.  The breathing machine keeps your lungs expanded and your temperature down.   DIET:  As you were doing prior to hospitalization, we recommend a well-balanced diet.  DRESSING / WOUND CARE / SHOWERING  Keep the surgical dressing until follow up.  The dressing is water proof, so you can shower without any extra covering.  IF THE DRESSING FALLS OFF or the wound gets wet inside, change the dressing with sterile gauze.  Please use good hand washing techniques before changing the dressing.  Do not use any lotions or creams on the incision until instructed by your surgeon.    ACTIVITY  o Increase activity slowly as tolerated, but follow the weight bearing instructions below.   o No driving for 6 weeks or until further direction given by your physician.  You cannot drive while taking narcotics.  o No lifting or carrying greater than 10 lbs. until further directed by your surgeon. o Avoid periods of inactivity such as sitting longer than an hour when not asleep. This helps prevent blood clots.  o You may return to work once you are authorized by your doctor.     WEIGHT BEARING   Weight bearing as tolerated with assist  device (walker, cane, etc) as directed, use it as long as suggested by your surgeon or therapist, typically at least 4-6 weeks.   EXERCISES  Results after joint replacement surgery are often greatly improved when you follow the exercise, range of motion and muscle strengthening exercises prescribed by your doctor. Safety measures are also important to protect the joint from further injury. Any time any of these exercises cause you to have increased pain or swelling, decrease what you are doing until you are comfortable again and then slowly increase them. If you have problems or questions, call your caregiver or physical therapist for advice.   Rehabilitation is important following a joint replacement. After just a few days of immobilization, the muscles of the leg can become weakened and shrink (atrophy).  These exercises are designed to build up the tone and strength of the thigh and leg muscles and to improve motion. Often times heat used for twenty to thirty minutes before working out will loosen up your tissues and help with improving the range of motion but do not use heat for the first two weeks following surgery (sometimes heat can increase post-operative swelling).   These exercises can be done on a training (exercise) mat, on the floor, on a table or on a bed. Use whatever works the best and is most comfortable for you.    Use music or television while you are exercising so that   the exercises are a pleasant break in your day. This will make your life better with the exercises acting as a break in your routine that you can look forward to.   Perform all exercises about fifteen times, three times per day or as directed.  You should exercise both the operative leg and the other leg as well.  Exercises include:   . Quad Sets - Tighten up the muscle on the front of the thigh (Quad) and hold for 5-10 seconds.   . Straight Leg Raises - With your knee straight (if you were given a brace, keep it on),  lift the leg to 60 degrees, hold for 3 seconds, and slowly lower the leg.  Perform this exercise against resistance later as your leg gets stronger.  . Leg Slides: Lying on your back, slowly slide your foot toward your buttocks, bending your knee up off the floor (only go as far as is comfortable). Then slowly slide your foot back down until your leg is flat on the floor again.  . Angel Wings: Lying on your back spread your legs to the side as far apart as you can without causing discomfort.  . Hamstring Strength:  Lying on your back, push your heel against the floor with your leg straight by tightening up the muscles of your buttocks.  Repeat, but this time bend your knee to a comfortable angle, and push your heel against the floor.  You may put a pillow under the heel to make it more comfortable if necessary.   A rehabilitation program following joint replacement surgery can speed recovery and prevent re-injury in the future due to weakened muscles. Contact your doctor or a physical therapist for more information on knee rehabilitation.    CONSTIPATION  Constipation is defined medically as fewer than three stools per week and severe constipation as less than one stool per week.  Even if you have a regular bowel pattern at home, your normal regimen is likely to be disrupted due to multiple reasons following surgery.  Combination of anesthesia, postoperative narcotics, change in appetite and fluid intake all can affect your bowels.   YOU MUST use at least one of the following options; they are listed in order of increasing strength to get the job done.  They are all available over the counter, and you may need to use some, POSSIBLY even all of these options:    Drink plenty of fluids (prune juice may be helpful) and high fiber foods Colace 100 mg by mouth twice a day  Senokot for constipation as directed and as needed Dulcolax (bisacodyl), take with full glass of water  Miralax (polyethylene glycol)  once or twice a day as needed.  If you have tried all these things and are unable to have a bowel movement in the first 3-4 days after surgery call either your surgeon or your primary doctor.    If you experience loose stools or diarrhea, hold the medications until you stool forms back up.  If your symptoms do not get better within 1 week or if they get worse, check with your doctor.  If you experience "the worst abdominal pain ever" or develop nausea or vomiting, please contact the office immediately for further recommendations for treatment.   ITCHING:  If you experience itching with your medications, try taking only a single pain pill, or even half a pain pill at a time.  You can also use Benadryl over the counter for itching or also to   help with sleep.   TED HOSE STOCKINGS:  Use stockings on both legs until for at least 2 weeks or as directed by physician office. They may be removed at night for sleeping.  MEDICATIONS:  See your medication summary on the "After Visit Summary" that nursing will review with you.  You may have some home medications which will be placed on hold until you complete the course of blood thinner medication.  It is important for you to complete the blood thinner medication as prescribed.  PRECAUTIONS:  If you experience chest pain or shortness of breath - call 911 immediately for transfer to the hospital emergency department.   If you develop a fever greater that 101 F, purulent drainage from wound, increased redness or drainage from wound, foul odor from the wound/dressing, or calf pain - CONTACT YOUR SURGEON.                                                   FOLLOW-UP APPOINTMENTS:  If you do not already have a post-op appointment, please call the office for an appointment to be seen by your surgeon.  Guidelines for how soon to be seen are listed in your "After Visit Summary", but are typically between 1-4 weeks after surgery.  OTHER INSTRUCTIONS:   Knee  Replacement:  Do not place pillow under knee, focus on keeping the knee straight while resting.    MAKE SURE YOU:  . Understand these instructions.  . Get help right away if you are not doing well or get worse.    Thank you for letting us be a part of your medical care team.  It is a privilege we respect greatly.  We hope these instructions will help you stay on track for a fast and full recovery!   Information on my medicine - XARELTO (Rivaroxaban)  This medication education was reviewed with me or my healthcare representative as part of my discharge preparation.  The pharmacist that spoke with me during my hospital stay was:    Why was Xarelto prescribed for you? Xarelto was prescribed for you to reduce the risk of blood clots forming after orthopedic surgery. The medical term for these abnormal blood clots is venous thromboembolism (VTE).  What do you need to know about xarelto ? Take your Xarelto ONCE DAILY at the same time every day. You may take it either with or without food.  If you have difficulty swallowing the tablet whole, you may crush it and mix in applesauce just prior to taking your dose.  Take Xarelto exactly as prescribed by your doctor and DO NOT stop taking Xarelto without talking to the doctor who prescribed the medication.  Stopping without other VTE prevention medication to take the place of Xarelto may increase your risk of developing a clot.  After discharge, you should have regular check-up appointments with your healthcare provider that is prescribing your Xarelto.    What do you do if you miss a dose? If you miss a dose, take it as soon as you remember on the same day then continue your regularly scheduled once daily regimen the next day. Do not take two doses of Xarelto on the same day.   Important Safety Information A possible side effect of Xarelto is bleeding. You should call your healthcare provider right away if you experience any of   the  following: ? Bleeding from an injury or your nose that does not stop. ? Unusual colored urine (red or dark brown) or unusual colored stools (red or black). ? Unusual bruising for unknown reasons. ? A serious fall or if you hit your head (even if there is no bleeding).  Some medicines may interact with Xarelto and might increase your risk of bleeding while on Xarelto. To help avoid this, consult your healthcare provider or pharmacist prior to using any new prescription or non-prescription medications, including herbals, vitamins, non-steroidal anti-inflammatory drugs (NSAIDs) and supplements.  This website has more information on Xarelto: www.xarelto.com.     

## 2020-03-30 NOTE — Op Note (Signed)
NAME:  Juan Decker                      MEDICAL RECORD NO.:  [id number]                             FACILITY:  [facility]      PHYSICIAN:  Madlyn Frankel. Charlann Boxer, M.D.  DATE OF BIRTH:  01/10/1961      DATE OF PROCEDURE:  03/30/2020                                     OPERATIVE REPORT         PREOPERATIVE DIAGNOSIS:  Left knee osteoarthritis.      POSTOPERATIVE DIAGNOSIS:  Left knee osteoarthritis.      FINDINGS:  The patient was noted to have complete loss of cartilage and   bone-on-bone arthritis with associated osteophytes in the medial and patellofemoral compartments of   the knee with a significant synovitis and associated effusion.  The patient had failed months of conservative treatment including medications, injection therapy, activity modification.     PROCEDURE:  Left total knee replacement.      COMPONENTS USED:  DePuy Attune rotating platform posterior stabilized knee   system, a size 5 femur, 6 tibia, size 6 mm PS AOX insert, and 38 anatomic patellar   button.      SURGEON:  Madlyn Frankel. Charlann Boxer, M.D.      ASSISTANT:  Dennie Bible, PA-C.      ANESTHESIA:  Regional and Spinal.      SPECIMENS:  None.      COMPLICATION:  None.      DRAINS:  None.  EBL: <200      TOURNIQUET TIME:   Total Tourniquet Time Documented: Thigh (Left) - 31 minutes Total: Thigh (Left) - 31 minutes  .      The patient was stable to the recovery room.      INDICATION FOR PROCEDURE:  Juan Decker is a 59 y.o. male patient of   mine.  The patient had been seen, evaluated, and treated for months conservatively in the   office with medication, activity modification, and injections.  The patient had   radiographic changes of bone-on-bone arthritis with endplate sclerosis and osteophytes noted.  Based on the radiographic changes and failed conservative measures, the patient   decided to proceed with definitive treatment, total knee replacement.  Risks of infection, DVT, component failure, need for  revision surgery, neurovascular injury were reviewed in the office setting.  The postop course was reviewed stressing the efforts to maximize post-operative satisfaction and function.  Consent was obtained for benefit of pain   relief.      PROCEDURE IN DETAIL:  The patient was brought to the operative theater.   Once adequate anesthesia, preoperative antibiotics, 2 gm of Ancef,1 gm of Tranexamic Acid, and 10 mg of Decadron administered, the patient was positioned supine with a left thigh tourniquet placed.  The  left lower extremity was prepped and draped in sterile fashion.  A time-   out was performed identifying the patient, planned procedure, and the appropriate extremity.      The left lower extremity was placed in the [facility] leg holder.  The leg was   exsanguinated, tourniquet elevated to 250 mmHg.  A midline incision was   made  followed by median parapatellar arthrotomy.  Following initial   exposure, attention was first directed to the patella.  Precut   measurement was noted to be 24 mm.  I resected down to 14 mm and used a   38 anatomic patellar button to restore patellar height as well as cover the cut surface.      The lug holes were drilled and a metal shim was placed to protect the   patella from retractors and saw blade during the procedure.      At this point, attention was now directed to the femur.  The femoral   canal was opened with a drill, irrigated to try to prevent fat emboli.  An   intramedullary rod was passed at 3 degrees valgus, 9 mm of bone was   resected off the distal femur.  Following this resection, the tibia was   subluxated anteriorly.  Using the extramedullary guide, 2 mm of bone was resected off   the proximal medial tibia.  We confirmed the gap would be   stable medially and laterally with a size 5 spacer block as well as confirmed that the tibial cut was perpendicular in the coronal plane, checking with an alignment rod.      Once this was done, I  sized the femur to be a size 5 in the anterior-   posterior dimension, chose a standard component based on medial and   lateral dimension.  The size 5 rotation block was then pinned in   position anterior referenced using the C-clamp to set rotation.  The   anterior, posterior, and  chamfer cuts were made without difficulty nor   notching making certain that I was along the anterior cortex to help   with flexion gap stability.      The final box cut was made off the lateral aspect of distal femur.      At this point, the tibia was sized to be a size 6.  The size 6 tray was   then pinned in position through the medial third of the tubercle,   drilled, and keel punched.  Trial reduction was now carried with a 5 femur,  6 tibia, a size 6 mm PS insert, and the 38 anatomic patella botton.  The knee was brought to full extension with good flexion stability with the patella   tracking through the trochlea without application of pressure.  Given   all these findings the trial components removed.  Final components were   opened and cement was mixed.  The knee was irrigated with normal saline solution and pulse lavage.  The synovial lining was   then injected with 30 cc of 0.25% Marcaine with epinephrine, 1 cc of Toradol and 30 cc of NS for a total of 61 cc.     Final implants were then cemented onto cleaned and dried cut surfaces of bone with the knee brought to extension with a size 6 mm PS trial insert.      Once the cement had fully cured, excess cement was removed   throughout the knee.  I confirmed that I was satisfied with the range of   motion and stability, and the final size 6 mm PS AOX insert was chosen.  It was   placed into the knee.      The tourniquet had been let down at 32 minutes.  No significant   hemostasis was required.  The extensor mechanism was then reapproximated using #1 Vicryl and #  1 Stratafix sutures with the knee   in flexion.  The   remaining wound was closed with 2-0  Vicryl and running 4-0 Monocryl.   The knee was cleaned, dried, dressed sterilely using Dermabond and   Aquacel dressing.  The patient was then   brought to recovery room in stable condition, tolerating the procedure   well.   Please note that Physician Assistant, Dennie Bible, PA-C was present for the entirety of the case, and was utilized for pre-operative positioning, peri-operative retractor management, general facilitation of the procedure and for primary wound closure at the end of the case.              Madlyn Frankel Charlann Boxer, M.D.    03/30/2020 1:34 PM

## 2020-03-30 NOTE — Anesthesia Procedure Notes (Signed)
Spinal  Patient location during procedure: OR Start time: 03/30/2020 12:10 PM End time: 03/30/2020 12:15 PM Staffing Anesthesiologist: Eilene Ghazi, MD Preanesthetic Checklist Completed: patient identified, IV checked, site marked, risks and benefits discussed, surgical consent, monitors and equipment checked, pre-op evaluation and timeout performed Spinal Block Patient position: sitting Prep: DuraPrep Patient monitoring: heart rate, cardiac monitor, continuous pulse ox and blood pressure Approach: midline Location: L3-4 Injection technique: single-shot Needle Needle type: Sprotte  Needle gauge: 24 G Needle length: 9 cm Assessment Sensory level: T6

## 2020-03-30 NOTE — Anesthesia Procedure Notes (Signed)
Anesthesia Procedure Image    

## 2020-03-30 NOTE — Anesthesia Postprocedure Evaluation (Signed)
Anesthesia Post Note  Patient: [name]  Procedure(s) Performed: TOTAL KNEE ARTHROPLASTY (Left Knee)     Patient location during evaluation: PACU Anesthesia Type: Spinal Level of consciousness: oriented and awake and alert Pain management: pain level controlled Vital Signs Assessment: post-procedure vital signs reviewed and stable Respiratory status: spontaneous breathing, respiratory function stable and patient connected to nasal cannula oxygen Cardiovascular status: blood pressure returned to baseline and stable Postop Assessment: no headache, no backache and no apparent nausea or vomiting Anesthetic complications: no   No complications documented.  Last Vitals:  Vitals:   03/30/20 1146 03/30/20 1400  BP:  133/79  Pulse: (!) 56 (!) 55  Resp: 15 16  Temp:  (!) 36.4 C  SpO2: 96% 100%    Last Pain:  Vitals:   03/30/20 1400  TempSrc:   PainSc: Asleep                 [name] S

## 2020-03-31 ENCOUNTER — Encounter (HOSPITAL_COMMUNITY): Payer: Self-pay | Admitting: Orthopedic Surgery

## 2020-03-31 DIAGNOSIS — M1712 Unilateral primary osteoarthritis, left knee: Secondary | ICD-10-CM | POA: Diagnosis not present

## 2020-03-31 DIAGNOSIS — E669 Obesity, unspecified: Secondary | ICD-10-CM | POA: Diagnosis present

## 2020-03-31 LAB — CBC
HCT: 42.6 % (ref 39.0–52.0)
Hemoglobin: 13.8 g/dL (ref 13.0–17.0)
MCH: 26.8 pg (ref 26.0–34.0)
MCHC: 32.4 g/dL (ref 30.0–36.0)
MCV: 82.9 fL (ref 80.0–100.0)
Platelets: 298 10*3/uL (ref 150–400)
RBC: 5.14 MIL/uL (ref 4.22–5.81)
RDW: 13.9 % (ref 11.5–15.5)
WBC: 19.1 10*3/uL — ABNORMAL HIGH (ref 4.0–10.5)
nRBC: 0 % (ref 0.0–0.2)

## 2020-03-31 LAB — BASIC METABOLIC PANEL
Anion gap: 8 (ref 5–15)
BUN: 23 mg/dL — ABNORMAL HIGH (ref 6–20)
CO2: 24 mmol/L (ref 22–32)
Calcium: 8.6 mg/dL — ABNORMAL LOW (ref 8.9–10.3)
Chloride: 100 mmol/L (ref 98–111)
Creatinine, Ser: 0.93 mg/dL (ref 0.61–1.24)
GFR calc Af Amer: >60 mL/min (ref >60)
GFR calc non Af Amer: >60 mL/min (ref >60)
Glucose, Bld: 166 mg/dL — ABNORMAL HIGH (ref 70–99)
Potassium: 4.9 mmol/L (ref 3.5–5.1)
Sodium: 132 mmol/L — ABNORMAL LOW (ref 135–145)

## 2020-03-31 MED ORDER — DOCUSATE SODIUM 100 MG PO CAPS
100.0000 mg | ORAL_CAPSULE | Freq: Two times a day (BID) | ORAL | 0 refills | Status: AC
Start: 2020-03-31 — End: ?

## 2020-03-31 MED ORDER — CYCLOBENZAPRINE HCL 10 MG PO TABS: 10.0000 mg | ORAL_TABLET | Freq: Three times a day (TID) | ORAL | 0 refills | Status: AC | PRN

## 2020-03-31 MED ORDER — POLYETHYLENE GLYCOL 3350 17 G PO PACK: 17.0000 g | PACK | Freq: Two times a day (BID) | ORAL | 0 refills | Status: AC

## 2020-03-31 MED ORDER — FERROUS SULFATE 325 (65 FE) MG PO TABS: 325.0000 mg | ORAL_TABLET | Freq: Three times a day (TID) | ORAL | 0 refills | Status: AC

## 2020-03-31 MED ORDER — OXYCODONE HCL 10 MG PO TABS: 10.0000 mg | ORAL_TABLET | ORAL | 0 refills | Status: AC | PRN

## 2020-03-31 MED ORDER — ACETAMINOPHEN 500 MG PO TABS
1000.0000 mg | ORAL_TABLET | Freq: Three times a day (TID) | ORAL | 0 refills | Status: AC
Start: 2020-03-31 — End: ?

## 2020-03-31 NOTE — TOC Transition Note (Signed)
Transition of Care [facility]) - CM/SW Discharge Note   Patient Details  Name: KAELYN NAUTA MRN: [id number] Date of Birth: 1961/03/19  Transition of Care [facility]) CM/SW Contact:  Lennart Pall, LCSW Phone Number: 03/31/2020, 10:32 AM   Clinical Narrative:   Met briefly with pt and confirming receipt of 3n1.  Plan for OPPT at [facility] in [place].  No TOC needs.    Final next level of care: OP Rehab Barriers to Discharge: No Barriers Identified   Patient Goals and CMS Choice Patient states their goals for this hospitalization and ongoing recovery are:: go home today   Choice offered to / list presented to : Patient  Discharge Placement                       Discharge Plan and Services                DME Arranged: 3-N-1 DME Agency: [employer] Date DME Agency Contacted: 03/31/20 Time DME Agency Contacted: 0930 Representative spoke with at DME Agency: Ovid Curd HH Arranged: NA [place] Agency: NA        Social Determinants of Health ([place]) Interventions     Readmission Risk Interventions No flowsheet data found.

## 2020-03-31 NOTE — Discharge Summary (Signed)
Patient ID: Juan Decker MRN: [id number] DOB/AGE: May 24, 1961 59 y.o.  Admit date: 03/30/2020 Discharge date: 03/31/2020  Admission Diagnoses:  Principal Problem:   Left knee OA Active Problems:   Status post total left knee replacement   Obese   Discharge Diagnoses:  Same  Past Medical History:  Diagnosis Date   Arthritis    Diabetes mellitus without complication (HCC)    type 2   Dysrhythmia    Afib   Hypertension    Pneumonia    Sleep apnea    wears cpap    Surgeries: Procedure(s):  LEFT TOTAL KNEE ARTHROPLASTY on 03/30/2020   Consultants: N/A  Discharged Condition: Improved  Hospital Course: TAVARION BABINGTON is an 59 y.o. male who was admitted 03/30/2020 for operative treatment ofOsteoarthritis of left knee. Patient has severe unremitting pain that affects sleep, daily activities, and work/hobbies. After pre-op clearance the patient was taken to the operating room on 03/30/2020 and underwent  Procedure(s):  LEFT TOTAL KNEE ARTHROPLASTY.    Patient was given perioperative antibiotics:  Anti-infectives (From admission, onward)   Start     Dose/Rate Route Frequency Ordered Stop   03/30/20 1800  ceFAZolin (ANCEF) IVPB 2g/100 mL premix        2 g 200 mL/hr over 30 Minutes Intravenous Every 6 hours 03/30/20 1721 03/31/20 0025   03/30/20 1749  ceFAZolin (ANCEF) 2-4 GM/100ML-% IVPB       Note to Pharmacy: [facility]   : cabinet override      03/30/20 1749 03/30/20 1830   03/30/20 1015  ceFAZolin (ANCEF) IVPB 2g/100 mL premix        2 g 200 mL/hr over 30 Minutes Intravenous On call to O.R. 03/30/20 1013 03/30/20 1216       Patient was given sequential compression devices, early ambulation, and chemoprophylaxis to prevent DVT.  Patient benefited maximally from hospital stay and there were no complications.    Recent vital signs:  Patient Vitals for the past 24 hrs:  BP Temp Temp src Pulse Resp SpO2 Height Weight  03/31/20 0455 (!) 143/88 97.7 F (36.5 C)  Oral (!) 58 16 99 % -- --  03/31/20 0059 137/81 97.8 F (36.6 C) Oral (!) 52 15 98 % -- --  03/30/20 2301 -- -- -- 60 16 92 % -- --  03/30/20 2122 (!) 146/76 98.3 F (36.8 C) Oral 63 18 95 % -- --  03/30/20 2034 (!) 143/77 98.1 F (36.7 C) Oral 64 18 96 % -- --  03/30/20 1930 139/79 98.1 F (36.7 C) Oral 65 19 95 % -- --  03/30/20 1829 (!) 173/93 (!) 97.5 F (36.4 C) Oral 61 16 96 % -- --  03/30/20 1800 (!) 148/84 -- -- 61 18 95 % -- --  03/30/20 1700 (!) 142/79 -- -- 62 18 (!) 89 % -- --  03/30/20 1615 (!) 142/101 98.2 F (36.8 C) -- 60 15 95 % -- --  03/30/20 1600 (!) 145/87 -- -- (!) 57 14 93 % -- --  03/30/20 1545 (!) 144/83 -- -- (!) 55 16 93 % -- --  03/30/20 1530 (!) 142/84 -- -- (!) 55 15 96 % -- --  03/30/20 1515 (!) 125/99 -- -- (!) 54 12 94 % -- --  03/30/20 1500 138/86 -- -- (!) 53 13 94 % -- --  03/30/20 1445 (!) 148/91 -- -- (!) 53 17 92 % -- --  03/30/20 1430 134/88 -- -- (!) 52 17 95 % -- --  03/30/20 1415 (!) 147/84 -- -- (!) 54 18 98 % -- --  03/30/20 1400 133/79 (!) 97.5 F (36.4 C) -- (!) 55 16 100 % -- --  03/30/20 1146 -- -- -- (!) 56 15 96 % -- --  03/30/20 1145 -- -- -- (!) 56 15 95 % -- --  03/30/20 1144 -- -- -- (!) 57 18 94 % -- --  03/30/20 1143 (!) 147/85 -- -- (!) 56 18 93 % -- --  03/30/20 1142 -- -- -- (!) 56 16 96 % -- --  03/30/20 1141 -- -- -- (!) 57 12 96 % -- --  03/30/20 1140 -- -- -- (!) 56 (!) 24 94 % -- --  03/30/20 1139 -- -- -- (!) 57 12 92 % -- --  03/30/20 1138 (!) 145/83 -- -- (!) 55 -- 94 % -- --  03/30/20 1044 -- -- -- -- -- -- 5\' 7"  (1.702 m) 110.7 kg  03/30/20 1020 (!) 139/92 -- -- (!) 58 -- 99 % -- --  03/30/20 1018 (!) 139/92 98.1 F (36.7 C) Oral (!) 58 18 98 % -- --     Recent laboratory studies:  Recent Labs    03/31/20 0320  WBC 19.1*  HGB 13.8  HCT 42.6  PLT 298  NA 132*  K 4.9  CL 100  CO2 24  BUN 23*  CREATININE 0.93  GLUCOSE 166*  CALCIUM 8.6*     Discharge Medications:   Allergies as of 03/31/2020       Reactions   Hydromorphone Swelling   In the eyes       Medication List    STOP taking these medications   oxyCODONE-acetaminophen 10-325 MG tablet Commonly known as: PERCOCET     TAKE these medications   acetaminophen 500 MG tablet Commonly known as: TYLENOL Take 2 tablets (1,000 mg total) by mouth every 8 (eight) hours.   amLODipine-benazepril 5-20 MG capsule Commonly known as: LOTREL Take 1 capsule by mouth daily.   cyclobenzaprine 10 MG tablet Commonly known as: FLEXERIL Take 1 tablet (10 mg total) by mouth 3 (three) times daily as needed for muscle spasms.   docusate sodium 100 MG capsule Commonly known as: Colace Take 1 capsule (100 mg total) by mouth 2 (two) times daily.   ferrous sulfate 325 (65 FE) MG tablet Commonly known as: FerrouSul Take 1 tablet (325 mg total) by mouth 3 (three) times daily with meals for 14 days.   flecainide 100 MG tablet Commonly known as: TAMBOCOR Take 100 mg by mouth 2 (two) times daily.   glipiZIDE 10 MG tablet Commonly known as: GLUCOTROL Take 10 mg by mouth 2 (two) times daily before a meal.   metFORMIN 1000 MG tablet Commonly known as: GLUCOPHAGE Take 1,000 mg by mouth 2 (two) times daily with a meal.   metoprolol succinate 100 MG 24 hr tablet Commonly known as: TOPROL-XL Take 100 mg by mouth daily. Take with or immediately following a meal.   morphine 30 MG 12 hr tablet Commonly known as: MS CONTIN Take 30 mg by mouth every 12 (twelve) hours.   Oxycodone HCl 10 MG Tabs Take 1-2 tablets (10-20 mg total) by mouth every 4 (four) hours as needed for moderate pain or severe pain.   polyethylene glycol 17 g packet Commonly known as: MIRALAX / GLYCOLAX Take 17 g by mouth 2 (two) times daily.   pravastatin 20 MG tablet Commonly known as: PRAVACHOL Take 20 mg by mouth  every evening.   pregabalin 100 MG capsule Commonly known as: LYRICA Take 100 mg by mouth 3 (three) times daily.   rivaroxaban 20 MG Tabs  tablet Commonly known as: XARELTO Take 20 mg by mouth daily with supper.   testosterone cypionate 200 MG/ML injection Commonly known as: DEPOTESTOSTERONE CYPIONATE Inject 100 mg into the skin every 14 (fourteen) days.   zolpidem 10 MG tablet Commonly known as: AMBIEN Take 10 mg by mouth at bedtime as needed for sleep.            Discharge Care Instructions  (From admission, onward)         Start     Ordered   03/31/20 0000  Change dressing       Comments: Maintain surgical dressing until follow up in the clinic. If the edges start to pull up, may reinforce with tape. If the dressing is no longer working, may remove and cover with gauze and tape, but must keep the area dry and clean.  Call with any questions or concerns.   03/31/20 0902          Diagnostic Studies: No results found.  Disposition: Home  Discharge Instructions    Call MD / Call 911   Complete by: As directed    If you experience chest pain or shortness of breath, CALL 911 and be transported to the hospital emergency room.  If you develope a fever above 101 F, pus (white drainage) or increased drainage or redness at the wound, or calf pain, call your surgeon's office.   Change dressing   Complete by: As directed    Maintain surgical dressing until follow up in the clinic. If the edges start to pull up, may reinforce with tape. If the dressing is no longer working, may remove and cover with gauze and tape, but must keep the area dry and clean.  Call with any questions or concerns.   Constipation Prevention   Complete by: As directed    Drink plenty of fluids.  Prune juice may be helpful.  You may use a stool softener, such as Colace (over the counter) 100 mg twice a day.  Use MiraLax (over the counter) for constipation as needed.   Diet - low sodium heart healthy   Complete by: As directed    Discharge instructions   Complete by: As directed    Maintain surgical dressing until follow up in the clinic. If  the edges start to pull up, may reinforce with tape. If the dressing is no longer working, may remove and cover with gauze and tape, but must keep the area dry and clean.  Follow up in 2 weeks at [facility]. Call with any questions or concerns.   Increase activity slowly as tolerated   Complete by: As directed    Weight bearing as tolerated with assist device (walker, cane, etc) as directed, use it as long as suggested by your surgeon or therapist, typically at least 4-6 weeks.   TED hose   Complete by: As directed    Use stockings (TED hose) for 2 weeks on both leg(s).  You may remove them at night for sleeping.       Follow-up Information    Durene Romans, MD. Schedule an appointment as soon as possible for a visit in 2 weeks.   Specialty: Orthopedic Surgery Contact information: [address] [place] [address] [place] [address] [phone]  Signed: Genelle Gather [facility] 03/31/2020, 9:04 AM

## 2020-03-31 NOTE — Progress Notes (Addendum)
     Subjective: 1 Day Post-Op Procedure(s) (LRB): TOTAL KNEE ARTHROPLASTY (Left)   Patient reports pain as mild, pain controlled. No reported events throughout the night.  Discussed the expectations moving forward.  Ready to be discharged home, if they do well with PT.  Follow up in the clinic in 2 weeks.  Knows to call with any questions or concerns.     Patient's anticipated LOS is less than 2 midnights, meeting these requirements: - Younger than 70 - Lives within 1 hour of care - Has a competent adult at home to recover with post-op recover - NO history of  - Coronary Artery Disease  - Heart failure  - Heart attack  - Stroke  - DVT/VTE  - Respiratory Failure/COPD  - Renal failure  - Anemia  - Advanced Liver disease       Objective:   VITALS:   Vitals:   03/31/20 0059 03/31/20 0455  BP: 137/81 (!) 143/88  Pulse: (!) 52 (!) 58  Resp: 15 16  Temp: 97.8 F (36.6 C) 97.7 F (36.5 C)  SpO2: 98% 99%    Dorsiflexion/Plantar flexion intact Incision: dressing C/D/I No cellulitis present Compartment soft  LABS Recent Labs    03/31/20 0320  HGB 13.8  HCT 42.6  WBC 19.1*  PLT 298    Recent Labs    03/31/20 0320  NA 132*  K 4.9  BUN 23*  CREATININE 0.93  GLUCOSE 166*     Assessment/Plan: 1 Day Post-Op Procedure(s) (LRB): TOTAL KNEE ARTHROPLASTY (Left) Foley cath d/c'ed Advance diet Up with therapy D/C IV fluids Discharge home Follow up in 2 weeks at [facility] Follow up with OLIN,Melroy Bougher D in 2 weeks.  Contact information:  [facility] [address] [place] [address] [phone]      Obese (BMI 30-39.9) Estimated body mass index is 38.22 kg/m as calculated from the following:   Height as of this encounter: 5\' 7"  (1.702 m).   Weight as of this encounter: 110.7 kg. Patient also counseled that weight may inhibit the healing process Patient counseled that losing weight will help with future health  issues        PA-C  [facility]  [place] [address], [address], [place], [place] Phone: [phone] [email or web address] Facebook  [phone]

## 2020-03-31 NOTE — Evaluation (Signed)
Physical Therapy Evaluation Patient Details Name: Juan Decker MRN: [id number] DOB: Jan 22, 1961 Today's Date: 03/31/2020   History of Present Illness  Pt s/p L TKR and with hx of DM, R RCR, and back surgery x 3  Clinical Impression  Pt s/p L TKR and presents with decreased L LE strength/ROM and post op pain limiting functional mobility.  Pt should progress to dc home with family assist.  Pt reports first OP PT is Friday 04/02/20.    Follow Up Recommendations Follow surgeon's recommendation for DC plan and follow-up therapies    Equipment Recommendations  3in1 (PT)    Recommendations for Other Services       Precautions / Restrictions Precautions Precautions: Knee;Fall Restrictions Weight Bearing Restrictions: No Other Position/Activity Restrictions: WBAT      Mobility  Bed Mobility Overal bed mobility: Needs Assistance Bed Mobility: Supine to Sit     Supine to sit: Min assist;Mod assist     General bed mobility comments: use of bed rail and assist to manage LEs and to bring trunk to upright  Transfers Overall transfer level: Needs assistance Equipment used: Rolling walker (2 wheeled) Transfers: Sit to/from Stand Sit to Stand: Min assist;Mod assist         General transfer comment: cues for LE management and use of UEs to self assist  Ambulation/Gait Ambulation/Gait assistance: Min assist Gait Distance (Feet): 6 Feet Assistive device: Rolling walker (2 wheeled) Gait Pattern/deviations: Step-to pattern;Decreased step length - right;Decreased step length - left;Shuffle;Trunk flexed Gait velocity: decr   General Gait Details: cues for sequence, posture, position from RW and increased UE WB  Stairs            Wheelchair Mobility    Modified Rankin (Stroke Patients Only)       Balance Overall balance assessment: Needs assistance Sitting-balance support: Feet supported;No upper extremity supported Sitting balance-Leahy Scale: Good     Standing  balance support: Bilateral upper extremity supported Standing balance-Leahy Scale: Poor                               Pertinent Vitals/Pain Pain Assessment: 0-10 Pain Score: 8  Pain Location: L knee Pain Descriptors / Indicators: Aching;Sore Pain Intervention(s): Limited activity within patient's tolerance;Monitored during session;Premedicated before session;Ice applied    Home Living Family/patient expects to be discharged to:: Private residence Living Arrangements: Spouse/significant other Available Help at Discharge: Family Type of Home: House Home Access: Stairs to enter Entrance Stairs-Rails: Right Entrance Stairs-Number of Steps: 3 Home Layout: One level Home Equipment: Environmental consultant - 2 wheels;Cane - single point      Prior Function Level of Independence: Independent               Hand Dominance        Extremity/Trunk Assessment   Upper Extremity Assessment Upper Extremity Assessment: Overall WFL for tasks assessed    Lower Extremity Assessment Lower Extremity Assessment: LLE deficits/detail    Cervical / Trunk Assessment Cervical / Trunk Assessment: Normal  Communication   Communication: No difficulties  Cognition Arousal/Alertness: Awake/alert Behavior During Therapy: WFL for tasks assessed/performed Overall Cognitive Status: Within Functional Limits for tasks assessed                                        General Comments      Exercises Total Joint Exercises  Ankle Circles/Pumps: AROM;Both;10 reps;Supine Quad Sets: AROM;Both;5 reps;Supine Straight Leg Raises: AAROM;Left;5 reps;Supine   Assessment/Plan    PT Assessment Patient needs continued PT services  PT Problem List Decreased strength;Decreased range of motion;Decreased activity tolerance;Decreased balance;Decreased mobility;Decreased knowledge of use of DME;Pain;Obesity       PT Treatment Interventions DME instruction;Gait training;Stair training;Functional  mobility training;Therapeutic activities;Therapeutic exercise;Patient/family education    PT Goals (Current goals can be found in the Care Plan section)  Acute Rehab PT Goals Patient Stated Goal: Regain IND PT Goal Formulation: With patient Time For Goal Achievement: 04/07/20 Potential to Achieve Goals: Good    Frequency 7X/week   Barriers to discharge        Co-evaluation               AM-PAC PT "6 Clicks" Mobility  Outcome Measure Help needed turning from your back to your side while in a flat bed without using bedrails?: A Lot Help needed moving from lying on your back to sitting on the side of a flat bed without using bedrails?: A Lot Help needed moving to and from a bed to a chair (including a wheelchair)?: A Little Help needed standing up from a chair using your arms (e.g., wheelchair or bedside chair)?: A Little Help needed to walk in hospital room?: A Little Help needed climbing 3-5 steps with a railing? : A Lot 6 Click Score: 15    End of Session Equipment Utilized During Treatment: Gait belt Activity Tolerance: Patient tolerated treatment well Patient left: in chair;with call bell/phone within reach;with chair alarm set;with family/visitor present Nurse Communication: Mobility status PT Visit Diagnosis: Difficulty in walking, not elsewhere classified (R26.2);Pain Pain - Right/Left: Left Pain - part of body: Knee    Time: 9147-8295 PT Time Calculation (min) (ACUTE ONLY): 24 min   Charges:   PT Evaluation $PT Eval Low Complexity: 1 Low          Mauro Kaufmann PT Acute Rehabilitation Services Pager [phone] Office [phone]   Sorayah Schrodt 03/31/2020, 1:01 PM

## 2020-03-31 NOTE — Progress Notes (Signed)
Physical Therapy Treatment Patient Details Name: Juan Decker MRN: [id number] DOB: [birth year] Today's Date: 03/31/2020    History of Present Illness Pt s/p L TKR and with hx of DM, R RCR, and back surgery x 3    PT Comments    Pt progressing steadily with mobility and with noted increased WB tolerance on L LE   Follow Up Recommendations  Follow surgeon's recommendation for DC plan and follow-up therapies     Equipment Recommendations  3in1 (PT)    Recommendations for Other Services       Precautions / Restrictions Precautions Precautions: Knee;Fall Restrictions Weight Bearing Restrictions: No Other Position/Activity Restrictions: WBAT    Mobility  Bed Mobility Overal bed mobility: Needs Assistance Bed Mobility: Sit to Supine     Supine to sit: Min assist     General bed mobility comments: Pt self-assisting L LE with belt  Transfers Overall transfer level: Needs assistance Equipment used: Rolling walker (2 wheeled) Transfers: Sit to/from Stand Sit to Stand: Min assist         General transfer comment: cues for LE management and use of UEs to self assist  Ambulation/Gait Ambulation/Gait assistance: Min assist;Min guard Gait Distance (Feet): 75 Feet Assistive device: Rolling walker (2 wheeled) Gait Pattern/deviations: Step-to pattern;Decreased step length - right;Decreased step length - left;Shuffle;Trunk flexed Gait velocity: decr   General Gait Details: cues for sequence, posture, position from RW and increased UE WB   Stairs             Wheelchair Mobility    Modified Rankin (Stroke Patients Only)       Balance Overall balance assessment: Needs assistance Sitting-balance support: Feet supported;No upper extremity supported Sitting balance-Leahy Scale: Good     Standing balance support: Bilateral upper extremity supported Standing balance-Leahy Scale: Fair                              Cognition Arousal/Alertness:  Awake/alert Behavior During Therapy: WFL for tasks assessed/performed Overall Cognitive Status: Within Functional Limits for tasks assessed                                        Exercises Total Joint Exercises Ankle Circles/Pumps: AROM;Both;Supine;20 reps Quad Sets: AROM;Supine;10 reps;Both Heel Slides: AAROM;Left;15 reps;Supine Straight Leg Raises: AAROM;Left;Supine;15 reps    General Comments        Pertinent Vitals/Pain Pain Assessment: 0-10 Pain Score: 7  Pain Location: L knee Pain Descriptors / Indicators: Aching;Sore Pain Intervention(s): Limited activity within patient's tolerance;Monitored during session;Premedicated before session;Ice applied    Home Living                      Prior Function            PT Goals (current goals can now be found in the care plan section) Acute Rehab PT Goals Patient Stated Goal: Regain IND PT Goal Formulation: With patient Time For Goal Achievement: 04/07/20 Potential to Achieve Goals: Good Progress towards PT goals: Progressing toward goals    Frequency    7X/week      PT Plan Current plan remains appropriate    Co-evaluation              AM-PAC PT "6 Clicks" Mobility   Outcome Measure  Help needed turning from your back to your side while  in a flat bed without using bedrails?: A Lot Help needed moving from lying on your back to sitting on the side of a flat bed without using bedrails?: A Lot Help needed moving to and from a bed to a chair (including a wheelchair)?: A Little Help needed standing up from a chair using your arms (e.g., wheelchair or bedside chair)?: A Little Help needed to walk in hospital room?: A Little Help needed climbing 3-5 steps with a railing? : A Lot 6 Click Score: 15    End of Session Equipment Utilized During Treatment: Gait belt Activity Tolerance: Patient tolerated treatment well Patient left: in bed;with call bell/phone within reach;with family/visitor  present Nurse Communication: Mobility status PT Visit Diagnosis: Difficulty in walking, not elsewhere classified (R26.2);Pain Pain - Right/Left: Left Pain - part of body: Knee     Time: 7939-0300 PT Time Calculation (min) (ACUTE ONLY): 30 min  Charges:  $Gait Training: 8-22 mins $Therapeutic Exercise: 8-22 mins                     Mauro Kaufmann PT Acute Rehabilitation Services Pager [phone] Office [phone]    Mayelin Panos 03/31/2020, 3:52 PM

## 2020-03-31 NOTE — Progress Notes (Signed)
Physical Therapy Treatment Patient Details Name: Juan Decker MRN: [id number] DOB: March 27, 1961 Today's Date: 03/31/2020    History of Present Illness Pt s/p L TKR and with hx of DM, R RCR, and back surgery x 3    PT Comments    Pt continues to progress well with mobility and this pm up to bathroom, negotiated stairs and performed HEP program - written instruction provided - spouse present.   Follow Up Recommendations  Follow surgeon's recommendation for DC plan and follow-up therapies     Equipment Recommendations  3in1 (PT)    Recommendations for Other Services       Precautions / Restrictions Precautions Precautions: Knee;Fall Restrictions Weight Bearing Restrictions: No Other Position/Activity Restrictions: WBAT    Mobility  Bed Mobility Overal bed mobility: Needs Assistance Bed Mobility: Supine to Sit     Supine to sit: Min guard     General bed mobility comments: Pt self-assisting L LE with belt  Transfers Overall transfer level: Needs assistance Equipment used: Rolling walker (2 wheeled) Transfers: Sit to/from Stand Sit to Stand: Min guard;Supervision         General transfer comment: cues for LE management and use of UEs to self assist  Ambulation/Gait Ambulation/Gait assistance: Min guard;Supervision Gait Distance (Feet): 75 Feet (and 15' into bathroom) Assistive device: Rolling walker (2 wheeled) Gait Pattern/deviations: Step-to pattern;Decreased step length - right;Decreased step length - left;Shuffle;Trunk flexed Gait velocity: decr   General Gait Details: cues for sequence, posture, position from RW and increased UE WB   Stairs Stairs: Yes Stairs assistance: Min assist Stair Management: One rail Left;Step to pattern;Forwards;With cane Number of Stairs: 3 General stair comments: cues for sequence and foot/cane placement   Wheelchair Mobility    Modified Rankin (Stroke Patients Only)       Balance Overall balance assessment: Needs  assistance Sitting-balance support: Feet supported;No upper extremity supported Sitting balance-Leahy Scale: Good     Standing balance support: Bilateral upper extremity supported Standing balance-Leahy Scale: Fair                              Cognition Arousal/Alertness: Awake/alert Behavior During Therapy: WFL for tasks assessed/performed Overall Cognitive Status: Within Functional Limits for tasks assessed                                        Exercises Total Joint Exercises Ankle Circles/Pumps: AROM;Both;Supine;20 reps Quad Sets: AROM;Supine;10 reps;Both Heel Slides: AAROM;Left;15 reps;Supine Straight Leg Raises: AAROM;Left;Supine;15 reps Goniometric ROM: AAROM L knee -8 - 35 - pain limited with muscle guarding.Caryl Ada    General Comments        Pertinent Vitals/Pain Pain Assessment: 0-10 Pain Score: 6  Pain Location: L knee Pain Descriptors / Indicators: Aching;Sore Pain Intervention(s): Limited activity within patient's tolerance;Monitored during session;Premedicated before session;Ice applied    Home Living                      Prior Function            PT Goals (current goals can now be found in the care plan section) Acute Rehab PT Goals Patient Stated Goal: Regain IND PT Goal Formulation: With patient Time For Goal Achievement: 04/07/20 Potential to Achieve Goals: Good Progress towards PT goals: Progressing toward goals    Frequency    7X/week  PT Plan Current plan remains appropriate    Co-evaluation              AM-PAC PT "6 Clicks" Mobility   Outcome Measure  Help needed turning from your back to your side while in a flat bed without using bedrails?: A Little Help needed moving from lying on your back to sitting on the side of a flat bed without using bedrails?: A Little Help needed moving to and from a bed to a chair (including a wheelchair)?: A Little Help needed standing up from a chair  using your arms (e.g., wheelchair or bedside chair)?: A Little Help needed to walk in hospital room?: A Little Help needed climbing 3-5 steps with a railing? : A Little 6 Click Score: 18    End of Session Equipment Utilized During Treatment: Gait belt Activity Tolerance: Patient tolerated treatment well Patient left: with call bell/phone within reach;with family/visitor present;in chair Nurse Communication: Mobility status PT Visit Diagnosis: Difficulty in walking, not elsewhere classified (R26.2);Pain Pain - Right/Left: Left Pain - part of body: Knee     Time: 1410-1445 PT Time Calculation (min) (ACUTE ONLY): 35 min  Charges:  $Gait Training: 8-22 mins $Therapeutic Exercise: 8-22 mins                     Mauro Kaufmann PT Acute Rehabilitation Services Pager [phone] Office [phone]    Sherrice Creekmore 03/31/2020, 3:57 PM

## 2020-04-01 ENCOUNTER — Emergency Department (HOSPITAL_COMMUNITY)
Admission: EM | Admit: 2020-04-01 | Discharge: 2020-04-01 | Discharge status: Against Medical Advice | Payer: Medicare Other
